# Patient Record
Sex: Male | Born: 1955 | Race: Black or African American | Hispanic: No | Marital: Single | Smoking: Current every day smoker
Health system: Southern US, Community
[De-identification: ages and names within clinical notes are randomized; demographics above are authoritative.]

## PROBLEM LIST (undated history)

## (undated) DIAGNOSIS — K219 Gastro-esophageal reflux disease without esophagitis: Secondary | ICD-10-CM

## (undated) HISTORY — PX: ELBOW SURGERY: SHX618

---

## 2014-05-17 ENCOUNTER — Emergency Department (HOSPITAL_COMMUNITY): Payer: Medicaid Other

## 2014-05-17 ENCOUNTER — Emergency Department (HOSPITAL_COMMUNITY)
Admission: EM | Admit: 2014-05-17 | Discharge: 2014-05-17 | Disposition: A | Discharge status: Home / Self Care | Payer: Medicaid Other | Attending: Family Medicine | Admitting: Family Medicine

## 2014-05-17 ENCOUNTER — Encounter (HOSPITAL_COMMUNITY): Payer: Self-pay | Admitting: Emergency Medicine

## 2014-05-17 DIAGNOSIS — W010XXA Fall on same level from slipping, tripping and stumbling without subsequent striking against object, initial encounter: Secondary | ICD-10-CM | POA: Diagnosis not present

## 2014-05-17 DIAGNOSIS — M19019 Primary osteoarthritis, unspecified shoulder: Secondary | ICD-10-CM | POA: Insufficient documentation

## 2014-05-17 DIAGNOSIS — Z791 Long term (current) use of non-steroidal anti-inflammatories (NSAID): Secondary | ICD-10-CM | POA: Insufficient documentation

## 2014-05-17 DIAGNOSIS — F17200 Nicotine dependence, unspecified, uncomplicated: Secondary | ICD-10-CM | POA: Insufficient documentation

## 2014-05-17 DIAGNOSIS — Y9389 Activity, other specified: Secondary | ICD-10-CM | POA: Insufficient documentation

## 2014-05-17 DIAGNOSIS — S4980XA Other specified injuries of shoulder and upper arm, unspecified arm, initial encounter: Secondary | ICD-10-CM | POA: Insufficient documentation

## 2014-05-17 DIAGNOSIS — Y929 Unspecified place or not applicable: Secondary | ICD-10-CM | POA: Diagnosis not present

## 2014-05-17 DIAGNOSIS — S46909A Unspecified injury of unspecified muscle, fascia and tendon at shoulder and upper arm level, unspecified arm, initial encounter: Secondary | ICD-10-CM | POA: Insufficient documentation

## 2014-05-17 DIAGNOSIS — M25511 Pain in right shoulder: Secondary | ICD-10-CM

## 2014-05-17 MED ORDER — HYDROCODONE-ACETAMINOPHEN 5-325 MG PO TABS: 1.0000 | ORAL_TABLET | Freq: Four times a day (QID) | ORAL | Status: DC | PRN

## 2014-05-17 MED ORDER — OXYCODONE-ACETAMINOPHEN 5-325 MG PO TABS: 1.0000 | ORAL_TABLET | Freq: Once | ORAL | Status: DC

## 2014-05-17 MED ORDER — MORPHINE SULFATE 4 MG/ML IJ SOLN
4.0000 mg | Freq: Once | INTRAMUSCULAR | Status: AC
Start: 2014-05-17 — End: 2014-05-17
  Administered 2014-05-17: 4 mg via INTRAMUSCULAR
  Filled 2014-05-17: qty 1

## 2014-05-17 NOTE — ED Notes (Signed)
Per pt sts right shoulder pain since a slip and fall in the shower this am.

## 2014-05-17 NOTE — Discharge Instructions (Signed)

## 2014-05-17 NOTE — ED Provider Notes (Signed)
CSN: [id number]     Arrival date & time 05/17/14  1327 History   First MD Initiated Contact with Patient 05/17/14 1422     Chief Complaint  Patient presents with  . Shoulder Pain     (Consider location/radiation/quality/duration/timing/severity/associated sxs/prior Treatment) HPI Comments: 58 year old male presents for evaluation of right shoulder pain. He fell in the shower earlier today and his arm got caught behind him. He has a history of arthritis in his shoulder and he thinks he is now broken his shoulder. Pain is severe, 10 out of 10, and throbbing. He describes it as feeling like a toothache. He is having very limited ability to move his arm, and any movement causes increased pain in the shoulder. No numbness distal to the injury, no other injury.  Patient is a 58 y.o. male presenting with shoulder pain.  Shoulder Pain Associated symptoms include arthralgias (see history of present illness).    History reviewed. No pertinent past medical history. History reviewed. No pertinent past surgical history. History reviewed. No pertinent family history. History  Substance Use Topics  . Smoking status: Current Every Day Smoker  . Smokeless tobacco: Not on file  . Alcohol Use: Yes    Review of Systems  Musculoskeletal: Positive for arthralgias (see history of present illness).  All other systems reviewed and are negative.     Allergies  Review of patient's allergies indicates no known allergies.  Home Medications   Prior to Admission medications   Medication Sig Start Date End Date Taking? Authorizing Provider  naproxen sodium (ANAPROX) 220 MG tablet Take 440 mg by mouth 2 (two) times daily as needed (pain).   Yes Historical Provider, MD  HYDROcodone-acetaminophen (NORCO) 5-325 MG per tablet Take 1 tablet by mouth every 6 (six) hours as needed for moderate pain. 05/17/14   Adrian Blackwater Darby Shadwick, PA-C   BP 162/89  Pulse 81  Temp(Src) 98.2 F (36.8 C)  Resp 18  Wt 140 lb (63.504  kg)  SpO2 95% Physical Exam  Nursing note and vitals reviewed. Constitutional: He is oriented to person, place, and time. He appears well-developed and well-nourished. No distress.  HENT:  Head: Normocephalic.  Cardiovascular:  Pulses:      Radial pulses are 2+ on the right side, and 2+ on the left side.  Pulmonary/Chest: Effort normal. No respiratory distress.  Musculoskeletal:       Right shoulder: He exhibits decreased range of motion, tenderness (Diffuse), bony tenderness, deformity (Appears to be deviated posteriorly), pain and decreased strength (Secondary to pain). He exhibits no swelling, no laceration and no spasm.  Neurological: He is alert and oriented to person, place, and time. Coordination normal.  Skin: Skin is warm and dry. No rash noted. He is not diaphoretic.  Psychiatric: He has a normal mood and affect. Judgment normal.    ED Course  Procedures (including critical care time) Labs Review Labs Reviewed - No data to display  Imaging Review Dg Shoulder Right  05/17/2014   CLINICAL DATA:  Status post fall.  Right shoulder pain.  EXAM: RIGHT SHOULDER - 2+ VIEW  COMPARISON:  None.  FINDINGS: No fracture or dislocation is identified. The patient has markedly advanced for age glenohumeral osteoarthritis with extensive subchondral cyst formation and sclerosis about the joint. The humeral head is posteriorly subluxed on the glenoid compatible with joint instability. The acromioclavicular joint is unremarkable. Image right lung and ribs appear normal.  IMPRESSION: No acute abnormality.  Markedly advanced for age glenohumeral osteoarthritis and findings compatible  with glenohumeral joint instability.   Electronically Signed   By: Drusilla Kanner M.D.   On: 05/17/2014 15:28     EKG Interpretation None      MDM   Final diagnoses:  Shoulder arthritis  Right shoulder pain    X-ray reveals only chronic changes, no acute fracture or dislocation. We'll treat with Vicodin when  necessary, swelling, followup with orthopedics.   Meds ordered this encounter  Medications  . DISCONTD: oxyCODONE-acetaminophen (PERCOCET/ROXICET) 5-325 MG per tablet 1 tablet    Sig:   . morphine 4 MG/ML injection 4 mg    Sig:   . naproxen sodium (ANAPROX) 220 MG tablet    Sig: Take 440 mg by mouth 2 (two) times daily as needed (pain).  Marland Kitchen HYDROcodone-acetaminophen (NORCO) 5-325 MG per tablet    Sig: Take 1 tablet by mouth every 6 (six) hours as needed for moderate pain.    Dispense:  20 tablet    Refill:  0    Order Specific Question:  Supervising Provider    Answer:  MERRELL, DAVID J [[id number]]       Graylon Good, PA-C 05/17/14 1620

## 2014-05-17 NOTE — ED Notes (Addendum)
C/o right shoulder pain since falling while getting out of bathtub today.

## 2014-05-18 NOTE — ED Provider Notes (Signed)
Medical screening examination/treatment/procedure(s) were performed by a resident physician or non-physician practitioner and as the supervising physician I was immediately available for consultation/collaboration.  Talyssa Gibas, MD Family Medicine   Latham Kinzler J Shaunn Tackitt, MD 05/18/14 1140 

## 2014-06-01 ENCOUNTER — Ambulatory Visit: Payer: Medicaid Other | Attending: Internal Medicine | Admitting: Internal Medicine

## 2014-06-01 ENCOUNTER — Encounter: Payer: Self-pay | Admitting: Internal Medicine

## 2014-06-01 VITALS — BP 142/80 | HR 77 | Temp 98.6°F | Resp 16 | Ht 69.0 in | Wt 142.0 lb

## 2014-06-01 DIAGNOSIS — M542 Cervicalgia: Secondary | ICD-10-CM | POA: Diagnosis not present

## 2014-06-01 DIAGNOSIS — Z23 Encounter for immunization: Secondary | ICD-10-CM

## 2014-06-01 DIAGNOSIS — F17200 Nicotine dependence, unspecified, uncomplicated: Secondary | ICD-10-CM | POA: Diagnosis not present

## 2014-06-01 DIAGNOSIS — R209 Unspecified disturbances of skin sensation: Secondary | ICD-10-CM | POA: Insufficient documentation

## 2014-06-01 DIAGNOSIS — M25519 Pain in unspecified shoulder: Secondary | ICD-10-CM | POA: Insufficient documentation

## 2014-06-01 DIAGNOSIS — R109 Unspecified abdominal pain: Secondary | ICD-10-CM | POA: Diagnosis present

## 2014-06-01 DIAGNOSIS — I10 Essential (primary) hypertension: Secondary | ICD-10-CM | POA: Diagnosis not present

## 2014-06-01 DIAGNOSIS — M25511 Pain in right shoulder: Secondary | ICD-10-CM

## 2014-06-01 MED ORDER — KETOROLAC TROMETHAMINE 60 MG/2ML IM SOLN
60.0000 mg | Freq: Once | INTRAMUSCULAR | Status: AC
  Administered 2014-06-01: 60 mg via INTRAMUSCULAR

## 2014-06-01 MED ORDER — CLONIDINE HCL 0.1 MG PO TABS
0.1000 mg | ORAL_TABLET | Freq: Once | ORAL | Status: AC
  Administered 2014-06-01: 0.1 mg via ORAL

## 2014-06-01 MED ORDER — LISINOPRIL 10 MG PO TABS: 10.0000 mg | ORAL_TABLET | Freq: Every day | ORAL | Status: AC

## 2014-06-01 MED ORDER — CYCLOBENZAPRINE HCL 10 MG PO TABS: 10.0000 mg | ORAL_TABLET | Freq: Three times a day (TID) | ORAL | Status: DC | PRN

## 2014-06-01 MED ORDER — TRAMADOL HCL 50 MG PO TABS: 50.0000 mg | ORAL_TABLET | Freq: Three times a day (TID) | ORAL | Status: DC | PRN

## 2014-06-01 NOTE — Patient Instructions (Signed)
Please apply for hospital discount with our office so that I may get you further imaging of your shoulder. You will also need to be seen by Sports Medicine or Orthopedics ASAP.   Please begin taking lisinopril once per day for your high blood pressure.  If you begin to experience a dry cough after taking this medication...please call office back. Thanks It was a pleasure to meet you!!

## 2014-06-01 NOTE — Progress Notes (Signed)
Pt is here today to establish care. Pt is C/O severe right shoulder pain. Pt recently re injured his shoulder falling in the bathroom.

## 2014-06-01 NOTE — Progress Notes (Signed)
Patient ID: Caleb Hansen, male   DOB: 31-Aug-1956, 58 y.o.   MRN: [id number]  NGE:[id number]  MWN:[id number]  DOB - 1956-06-04  CC:  Chief Complaint  Patient presents with  . Establish Care       HPI: Caleb Hansen is a 58 y.o. male here today to establish medical care.  Patient presents to clinic today as a follow up from a Urgent care visit from 8/26. He states that he fell in the shower in which his arm got caught behind him.  He had a xray at that time and was found to only have chronic changes on exam.  He was given pain medication and told to follow up with orthopedics.  Today he reports that he injured his right shoulder several years ago during a workout routine and now is having continued pain.  He has had a cortisone injection in May of this year.  He has tried ibuprofen, aleve, and tylenol 3 for pain, which is now not helping the pain and  is now giving him stomach pain.  He states that he just got over H. Pylori a few months ago.  He reports that he has had the pain long term which was only rated at about a 5 out of 10.  He now states that the pain is a 10/10.     No Known Allergies History reviewed. No pertinent past medical history. Current Outpatient Prescriptions on File Prior to Visit  Medication Sig Dispense Refill  . HYDROcodone-acetaminophen (NORCO) 5-325 MG per tablet Take 1 tablet by mouth every 6 (six) hours as needed for moderate pain.  20 tablet  0  . naproxen sodium (ANAPROX) 220 MG tablet Take 440 mg by mouth 2 (two) times daily as needed (pain).       No current facility-administered medications on file prior to visit.   Family History  Problem Relation Age of Onset  . Diabetes Mother   . Hypertension Mother    History   Social History  . Marital Status: Single    Spouse Name: N/A    Number of Children: N/A  . Years of Education: N/A   Occupational History  . Not on file.   Social History Main Topics  . Smoking status: Current Every Day Smoker    . Smokeless tobacco: Not on file  . Alcohol Use: Yes  . Drug Use: Not on file  . Sexual Activity: Not on file   Other Topics Concern  . Not on file   Social History Narrative  . No narrative on file    Review of Systems  Constitutional: Negative for fever and chills.  Eyes: Negative for blurred vision.  Musculoskeletal: Positive for joint pain (shoulder) and neck pain.       Shoulder pain   Neurological: Positive for tingling (in right arm and fingers). Negative for headaches.      Objective:   Filed Vitals:   06/01/14 1520  BP: 180/108  Pulse: 77  Temp: 98.6 F (37 C)  Resp: 16    Physical Exam  Constitutional: He is oriented to person, place, and time.  HENT:  Right Ear: External ear normal.  Left Ear: External ear normal.  Mouth/Throat: Oropharynx is clear and moist.  Eyes: Conjunctivae and EOM are normal. Pupils are equal, round, and reactive to light.  Neck: Normal range of motion. Neck supple.  Cardiovascular: Normal rate, regular rhythm and normal heart sounds.   Pulmonary/Chest: Effort normal and breath sounds normal.  Abdominal: Bowel  sounds are normal. He exhibits no distension. There is tenderness.  Musculoskeletal: Normal range of motion.  Neurological: He is alert and oriented to person, place, and time. He has normal reflexes.  Skin: Skin is warm and dry.  Psychiatric: He has a normal mood and affect. Thought content normal.     No results found for this basename: WBC, HGB, HCT, MCV, PLT   No results found for this basename: CREATININE, BUN, NA, K, CL, CO2    No results found for this basename: HGBA1C   Lipid Panel  No results found for this basename: chol, trig, hdl, cholhdl, vldl, ldlcalc       Assessment and plan:   Caleb Hansen was seen today for establish care.  Diagnoses and associated orders for this visit:  Essential hypertension - Begin lisinopril (PRINIVIL,ZESTRIL) 10 MG tablet; Take 1 tablet (10 mg total) by mouth  daily. - cloNIDine (CATAPRES) tablet 0.1 mg; Take 1 tablet (0.1 mg total) by mouth once.  Pain in joint, shoulder region, right - Ambulatory referral to Sports Medicine - traMADol (ULTRAM) 50 MG tablet; Take 1 tablet (50 mg total) by mouth every 8 (eight) hours as needed. - ketorolac (TORADOL) injection 60 mg; Inject 2 mLs (60 mg total) into the muscle once. - cyclobenzaprine (FLEXERIL) 10 MG tablet; Take 1 tablet (10 mg total) by mouth 3 (three) times daily as needed for muscle spasms.     Return in about 3 months (around 08/31/2014).  The patient was given clear instructions to go to ER or return to medical center if symptoms don't improve, worsen or new problems develop. The patient verbalized understanding.    Holland Commons, NP-C [facility] [phone] 06/16/2014, 8:19 PM

## 2014-08-08 ENCOUNTER — Encounter (HOSPITAL_COMMUNITY): Payer: Self-pay | Admitting: Emergency Medicine

## 2014-08-08 ENCOUNTER — Emergency Department (HOSPITAL_COMMUNITY)
Admission: EM | Admit: 2014-08-08 | Discharge: 2014-08-08 | Disposition: A | Discharge status: Home / Self Care | Payer: No Typology Code available for payment source | Attending: Emergency Medicine | Admitting: Emergency Medicine

## 2014-08-08 DIAGNOSIS — Y92410 Unspecified street and highway as the place of occurrence of the external cause: Secondary | ICD-10-CM | POA: Diagnosis not present

## 2014-08-08 DIAGNOSIS — Y998 Other external cause status: Secondary | ICD-10-CM | POA: Diagnosis not present

## 2014-08-08 DIAGNOSIS — Z79899 Other long term (current) drug therapy: Secondary | ICD-10-CM | POA: Insufficient documentation

## 2014-08-08 DIAGNOSIS — S24109A Unspecified injury at unspecified level of thoracic spinal cord, initial encounter: Secondary | ICD-10-CM | POA: Diagnosis not present

## 2014-08-08 DIAGNOSIS — Y9389 Activity, other specified: Secondary | ICD-10-CM | POA: Insufficient documentation

## 2014-08-08 DIAGNOSIS — Z720 Tobacco use: Secondary | ICD-10-CM | POA: Insufficient documentation

## 2014-08-08 DIAGNOSIS — M25511 Pain in right shoulder: Secondary | ICD-10-CM

## 2014-08-08 DIAGNOSIS — S4991XA Unspecified injury of right shoulder and upper arm, initial encounter: Secondary | ICD-10-CM | POA: Insufficient documentation

## 2014-08-08 DIAGNOSIS — M549 Dorsalgia, unspecified: Secondary | ICD-10-CM
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
## 2014-08-10 ENCOUNTER — Encounter (HOSPITAL_COMMUNITY): Payer: Self-pay | Admitting: *Deleted

## 2014-08-10 ENCOUNTER — Emergency Department (HOSPITAL_COMMUNITY)
Admission: EM | Admit: 2014-08-10 | Discharge: 2014-08-10 | Disposition: A | Discharge status: Home / Self Care | Payer: No Typology Code available for payment source | Attending: Emergency Medicine | Admitting: Emergency Medicine

## 2014-08-10 DIAGNOSIS — Z79899 Other long term (current) drug therapy: Secondary | ICD-10-CM | POA: Insufficient documentation

## 2014-08-10 DIAGNOSIS — Z720 Tobacco use: Secondary | ICD-10-CM | POA: Insufficient documentation

## 2014-08-10 DIAGNOSIS — Y9389 Activity, other specified: Secondary | ICD-10-CM | POA: Insufficient documentation

## 2014-08-10 DIAGNOSIS — M5416 Radiculopathy, lumbar region: Secondary | ICD-10-CM | POA: Insufficient documentation

## 2014-08-10 DIAGNOSIS — S39012A Strain of muscle, fascia and tendon of lower back, initial encounter: Secondary | ICD-10-CM | POA: Insufficient documentation

## 2014-08-10 DIAGNOSIS — Y92410 Unspecified street and highway as the place of occurrence of the external cause: Secondary | ICD-10-CM | POA: Insufficient documentation

## 2014-08-10 DIAGNOSIS — Y998 Other external cause status: Secondary | ICD-10-CM | POA: Insufficient documentation

## 2014-08-10 DIAGNOSIS — S34109A Unspecified injury to unspecified level of lumbar spinal cord, initial encounter: Secondary | ICD-10-CM | POA: Diagnosis present

## 2014-08-10 MED ORDER — CYCLOBENZAPRINE HCL 10 MG PO TABS: 10.0000 mg | ORAL_TABLET | Freq: Two times a day (BID) | ORAL | Status: DC | PRN

## 2014-08-10 MED ORDER — TRAMADOL HCL 50 MG PO TABS
50.0000 mg | ORAL_TABLET | Freq: Once | ORAL | Status: AC
Start: 2014-08-10 — End: 2014-08-10
  Administered 2014-08-10: 50 mg via ORAL
  Filled 2014-08-10: qty 1

## 2014-08-10 MED ORDER — TRAMADOL HCL 50 MG PO TABS
50.0000 mg | ORAL_TABLET | Freq: Four times a day (QID) | ORAL | Status: AC | PRN
Start: 2014-08-10 — End: ?

## 2014-08-10 MED ORDER — CYCLOBENZAPRINE HCL 10 MG PO TABS
10.0000 mg | ORAL_TABLET | Freq: Once | ORAL | Status: AC
  Administered 2014-08-10: 10 mg via ORAL
  Filled 2014-08-10: qty 1

## 2014-08-10 NOTE — Discharge Instructions (Signed)
Please take ibuprofen 400mg  (this is normally 2 over the counter pills) every 6 hours (take with food to minimze stomach irritation).   Take  flexeril and/or tramadol for breakthrough pain, do not drink alcohol, drive, care for children or perfom other critical tasks while taking flexeril and/or flxeril .  Please follow with your primary care doctor in the next 2 days for a check-up. They must obtain records for further management.   Do not hesitate to return to the Emergency Department for any new, worsening or concerning symptoms.    Lumbosacral Radiculopathy Lumbosacral radiculopathy is a pinched nerve or nerves in the low back (lumbosacral area). When this happens you may have weakness in your legs and may not be able to stand on your toes. You may have pain going down into your legs. There may be difficulties with walking normally. There are many causes of this problem. Sometimes this may happen from an injury, or simply from arthritis or boney problems. It may also be caused by other illnesses such as diabetes. If there is no improvement after treatment, further studies may be done to find the exact cause. DIAGNOSIS  X-rays may be needed if the problems become long standing. Electromyograms may be done. This study is one in which the working of nerves and muscles is studied. HOME CARE INSTRUCTIONS   Applications of ice packs may be helpful. Ice can be used in a plastic bag with a towel around it to prevent frostbite to skin. This may be used every 2 hours for 20 to 30 minutes, or as needed, while awake, or as directed by your caregiver.  Only take over-the-counter or prescription medicines for pain, discomfort, or fever as directed by your caregiver.  If physical therapy was prescribed, follow your caregiver's directions. SEEK IMMEDIATE MEDICAL CARE IF:   You have pain not controlled with medications.  You seem to be getting worse rather than better.  You develop increasing weakness  in your legs.  You develop loss of bowel or bladder control.  You have difficulty with walking or balance, or develop clumsiness in the use of your legs.  You have a fever. MAKE SURE YOU:   Understand these instructions.  Will watch your condition.  Will get help right away if you are not doing well or get worse. Document Released: 09/08/2005 Document Revised: 12/01/2011 Document Reviewed: 04/28/2008 [facility]ExitCare Patient Information 2015 [place]ExitCare, [place]LLC. This information is not intended to replace advice given to you by your health care provider. Make sure you discuss any questions you have with your health care provider.
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
## 2014-10-02 ENCOUNTER — Telehealth: Payer: Self-pay | Admitting: Internal Medicine

## 2014-10-02 NOTE — Telephone Encounter (Signed)
[facility] called to NPI, patient is currently changing primary care.

## 2016-01-17 LAB — GLUCOSE, POCT (MANUAL RESULT ENTRY): POC Glucose: 108 mg/dl — AB (ref 70–99)

## 2016-03-03 ENCOUNTER — Ambulatory Visit: Payer: Medicaid Other | Admitting: Physical Therapy

## 2016-03-04 ENCOUNTER — Ambulatory Visit: Payer: Medicaid Other | Attending: Internal Medicine | Admitting: Physical Therapy

## 2016-03-04 ENCOUNTER — Encounter: Payer: Self-pay | Admitting: Physical Therapy

## 2016-03-04 DIAGNOSIS — M6281 Muscle weakness (generalized): Secondary | ICD-10-CM | POA: Diagnosis present

## 2016-03-04 DIAGNOSIS — M79641 Pain in right hand: Secondary | ICD-10-CM | POA: Diagnosis present

## 2016-03-04 DIAGNOSIS — M25521 Pain in right elbow: Secondary | ICD-10-CM | POA: Insufficient documentation

## 2016-03-04 DIAGNOSIS — M25511 Pain in right shoulder: Secondary | ICD-10-CM | POA: Diagnosis not present

## 2016-03-04 NOTE — Therapy (Signed)
[facility] [address] [place], [place], [address] Phone: [phone]   Fax:  [phone]  Physical Therapy Evaluation  Patient Details  Name: Caleb Hansen MRN: [id number] Date of Birth: Feb 07, 1956 Referring Provider: Kerrie Pleasure, MD  Encounter Date: 03/04/2016      PT End of Session - 03/04/16 1530    Visit Number 1   Authorization Type medicaid   PT Start Time 1330   PT Stop Time 1408   PT Time Calculation (min) 38 min   Activity Tolerance Patient tolerated treatment well   Behavior During Therapy Caleb Hansen for tasks assessed/performed      History reviewed. No pertinent past medical history.  Past Surgical History  Procedure Laterality Date  . Elbow surgery Left     There were no vitals filed for this visit.       Subjective Assessment - 03/04/16 1336    Subjective Arthritis and rotator cuff pain. Pain has recently begun travelling farther down arm. Feels like elbow is on fire and now feels N/T in hand. Reports doing yard work a couple of times a week. Fell on the head of a nail in a plank of wood (caught fall with hand) and that was when he began feeling pain in shoulder and arm (approx 1 month ago).    Currently in Pain? Yes   Pain Score 7    Pain Descriptors / Indicators Burning;Throbbing   Pain Radiating Towards RUE to hand (radial nerve distribution)   Pain Onset 1 to 4 weeks ago   Pain Frequency Constant   Aggravating Factors  reaching OH and behind back, laying on R shoulder, letting arm hand (feels throbbing in elbow)   Pain Relieving Factors rest            [facility] PT Assessment - 03/04/16 0001    Assessment   Medical Diagnosis R elbow pain   Referring Provider Kerrie Pleasure, MD   Onset Date/Surgical Date 02/13/16  approximately   Hand Dominance Right   Next MD Visit will call to schedule   Prior Therapy no   Precautions   Precautions None   Restrictions   Weight Bearing Restrictions No   Balance Screen   Has the patient fallen in the past 6 months Yes   How many times? 1  caught fall using RUE   Home Environment   Living Environment Private residence   Living Arrangements Other relatives   Type of Home House   Prior Function   Level of Independence Independent   Cognition   Overall Cognitive Status Within Functional Limits for tasks assessed   Sensation   Light Touch Impaired by gross assessment   Posture/Postural Control   Posture Comments winging scapula bilat   ROM / Strength   AROM / PROM / Strength AROM;PROM;Strength   AROM   AROM Assessment Site Shoulder   Right/Left Shoulder Right   Right Shoulder Flexion 74 Degrees   Right Shoulder ABduction 40 Degrees   Right Shoulder Internal Rotation --  HBB S1   Right Shoulder External Rotation --  HBH C8   PROM   PROM Assessment Site Shoulder   Right/Left Shoulder Right   Right Shoulder Flexion 120 Degrees  pain   Strength   Overall Strength Comments grip significantly less in R.    Strength Assessment Site Shoulder   Right/Left Shoulder Right   Right Shoulder Flexion 3-/5  unable to move through ROM   Right Shoulder Extension 3-/5  Right Shoulder ABduction 3-/5   Right Shoulder Internal Rotation 3+/5   Right Shoulder External Rotation 3+/5   Palpation   Palpation comment no TTP in elbow                   [facility] Adult PT Treatment/Exercise - 03/04/16 0001    Exercises   Exercises Shoulder   Shoulder Exercises: Seated   Other Seated Exercises cervical retraction x10   Shoulder Exercises: Standing   Retraction 10 reps   Shoulder Exercises: Isometric Strengthening   Extension 5X10"   ABduction 5X10"   Shoulder Exercises: Stretch   Table Stretch - Flexion 5 reps;10 seconds                PT Education - 03/04/16 1529    Education provided Yes   Education Details anatomy of condition, POC, HEP, radicular pain   Person(s) Educated Patient   Methods  Explanation;Demonstration;Tactile cues;Verbal cues;Handout   Comprehension Verbalized understanding;Returned demonstration;Verbal cues required;Tactile cues required                    Plan - 03/04/16 1530    Clinical Impression Statement Pt presents with complaints of periscapular pain that travels into shoulder, burning in elbow and N/T in 4th and 5th digits. Denied tenderness to palpation in elbow and no notable weakness surrounding elbow complex. Significant weakness and pain noted in shoulder girdle and neural tension of radial nerve. Pt was provided with HEP to improve stability surrounding joint. Pt reprots he is considering surgical intervention but had to come to PT first.    PT Treatment/Interventions Therapeutic activities;Therapeutic exercise;Patient/family education   PT Next Visit Plan one-time visit   Consulted and Agree with Plan of Care Patient      Patient will benefit from skilled therapeutic intervention in order to improve the following deficits and impairments:  Pain, Impaired sensation, Decreased strength, Decreased range of motion  Visit Diagnosis: Pain in right shoulder - Plan: PT plan of care cert/re-cert  Pain in right elbow - Plan: PT plan of care cert/re-cert  Pain in right hand - Plan: PT plan of care cert/re-cert  Muscle weakness (generalized) - Plan: PT plan of care cert/re-cert     Problem List There are no active problems to display for this patient.  Willodene Stallings C. Laelani Vasko PT, DPT 03/04/2016 3:37 PM   [facility] [address] [place], [place], [address] Phone: [phone]   Fax:  [phone]  Name: Caleb ClosRonaldo Hansen MRN: [id number] Date of Birth: 07-Aug-1956

## 2016-06-25 ENCOUNTER — Other Ambulatory Visit: Payer: Self-pay | Admitting: Orthopedic Surgery

## 2016-06-25 DIAGNOSIS — M19011 Primary osteoarthritis, right shoulder: Secondary | ICD-10-CM

## 2016-07-11 ENCOUNTER — Other Ambulatory Visit: Payer: Medicaid Other

## 2020-11-06 ENCOUNTER — Other Ambulatory Visit: Payer: Self-pay

## 2020-11-06 ENCOUNTER — Emergency Department (HOSPITAL_COMMUNITY): Payer: Medicaid Other

## 2020-11-06 ENCOUNTER — Encounter (HOSPITAL_COMMUNITY): Payer: Self-pay | Admitting: Emergency Medicine

## 2020-11-06 ENCOUNTER — Emergency Department (HOSPITAL_COMMUNITY)
Admission: EM | Admit: 2020-11-06 | Discharge: 2020-11-06 | Disposition: A | Discharge status: Home / Self Care | Payer: Medicaid Other | Attending: Emergency Medicine | Admitting: Emergency Medicine

## 2020-11-06 DIAGNOSIS — Z79899 Other long term (current) drug therapy: Secondary | ICD-10-CM | POA: Insufficient documentation

## 2020-11-06 DIAGNOSIS — I160 Hypertensive urgency: Secondary | ICD-10-CM | POA: Diagnosis not present

## 2020-11-06 DIAGNOSIS — R519 Headache, unspecified: Secondary | ICD-10-CM | POA: Diagnosis present

## 2020-11-06 DIAGNOSIS — F17210 Nicotine dependence, cigarettes, uncomplicated: Secondary | ICD-10-CM | POA: Diagnosis not present

## 2020-11-06 DIAGNOSIS — I10 Essential (primary) hypertension: Secondary | ICD-10-CM | POA: Insufficient documentation

## 2020-11-06 LAB — CBC
HCT: 43.3 % (ref 39.0–52.0)
Hemoglobin: 15 g/dL (ref 13.0–17.0)
MCH: 33.9 pg (ref 26.0–34.0)
MCHC: 34.6 g/dL (ref 30.0–36.0)
MCV: 98 fL (ref 80.0–100.0)
Platelets: 247 10*3/uL (ref 150–400)
RBC: 4.42 MIL/uL (ref 4.22–5.81)
RDW: 12.4 % (ref 11.5–15.5)
WBC: 6.7 10*3/uL (ref 4.0–10.5)
nRBC: 0 % (ref 0.0–0.2)

## 2020-11-06 LAB — BASIC METABOLIC PANEL
Anion gap: 12 (ref 5–15)
BUN: 10 mg/dL (ref 8–23)
CO2: 25 mmol/L (ref 22–32)
Calcium: 9.1 mg/dL (ref 8.9–10.3)
Chloride: 98 mmol/L (ref 98–111)
Creatinine, Ser: 0.66 mg/dL (ref 0.61–1.24)
GFR, Estimated: >60 mL/min (ref >60)
Glucose, Bld: 96 mg/dL (ref 70–99)
Potassium: 4 mmol/L (ref 3.5–5.1)
Sodium: 135 mmol/L (ref 135–145)

## 2020-11-06 LAB — TROPONIN I (HIGH SENSITIVITY): Troponin I (High Sensitivity): 5 ng/L (ref <18)

## 2020-11-06 MED ORDER — LISINOPRIL 10 MG PO TABS
10.0000 mg | ORAL_TABLET | Freq: Once | ORAL | Status: AC
  Administered 2020-11-06: 10 mg via ORAL
  Filled 2020-11-06: qty 1

## 2020-11-06 MED ORDER — METHADONE HCL 10 MG/ML PO CONC
80.0000 mg | Freq: Once | ORAL | Status: AC
  Administered 2020-11-06: 80 mg via ORAL
  Filled 2020-11-06: qty 8
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
## 2020-11-06 NOTE — ED Notes (Signed)
Called x1, no response.

## 2020-11-06 NOTE — ED Provider Notes (Signed)
[facility]-EMERGENCY DEPT Provider Note   CSN: [id number] Arrival date & time: 11/06/20  1131     History Chief Complaint  Patient presents with  . Chest Pain  . Hypertension  . Emesis  . Headache    Caleb Hansen is a 65 y.o. male.  HPI Patient presents with concern of hypertensive urgency, headache.  Patient states" I feel good" but notes that he does have mild diffuse headache. No new weakness, or other complaints. He has history of methadone clinic visits, going back 2 years, but he missed today's due to oversleeping. Seems as though the headache began at some point earlier in the day, without specificity. He denies chest pain, dyspnea or other focal complaints.  Today, he saw his sister, complained about headache, and while checking his blood pressure found to be elevated.  With hypertensive urgency, headache, she suggested he come here for evaluation.  In the interim, he called his physician's office, and obtained a prescription which will be available tomorrow for antihypertensives.     History reviewed. No pertinent past medical history.  There are no problems to display for this patient.   Past Surgical History:  Procedure Laterality Date  . ELBOW SURGERY Left        Family History  Problem Relation Age of Onset  . Diabetes Mother   . Hypertension Mother     Social History   Tobacco Use  . Smoking status: Current Every Day Smoker    Packs/day: 0.50    Types: Cigarettes  Substance Use Topics  . Alcohol use: Yes    Home Medications Prior to Admission medications   Medication Sig Start Date End Date Taking? Authorizing Provider  cyclobenzaprine (FLEXERIL) 10 MG tablet Take 1 tablet (10 mg total) by mouth 2 (two) times daily as needed for muscle spasms. 08/10/14   Pisciotta, Joni Reining, PA-C  lisinopril (PRINIVIL,ZESTRIL) 10 MG tablet Take 1 tablet (10 mg total) by mouth daily. 06/01/14   Ambrose Finland, NP  naproxen sodium  (ANAPROX) 220 MG tablet Take 660 mg by mouth 2 (two) times daily as needed (pain).     [provider]  traMADol (ULTRAM) 50 MG tablet Take 1 tablet (50 mg total) by mouth every 6 (six) hours as needed. 08/10/14   Pisciotta, Joni Reining, PA-C    Allergies    Patient has no known allergies.  Review of Systems   Review of Systems  Constitutional:       Per HPI, otherwise negative  HENT:       Per HPI, otherwise negative  Respiratory:       Per HPI, otherwise negative  Cardiovascular:       Per HPI, otherwise negative  Gastrointestinal: Negative for vomiting.  Endocrine:       Negative aside from HPI  Genitourinary:       Neg aside from HPI   Musculoskeletal:       Per HPI, otherwise negative  Skin: Negative.   Neurological: Positive for headaches. Negative for syncope and weakness.    Physical Exam Updated Vital Signs BP (!) 179/117 (BP Location: Right Arm)   Pulse 78   Temp 98.5 F (36.9 C) (Oral)   Resp 18   Ht 5\' 9"  (1.753 m)   Wt 65 kg   SpO2 93%   BMI 21.16 kg/m   Physical Exam Vitals and nursing note reviewed.  Constitutional:      General: He is not in acute distress.    Appearance:  He is well-developed.  HENT:     Head: Normocephalic and atraumatic.  Eyes:     Extraocular Movements: EOM normal.     Conjunctiva/sclera: Conjunctivae normal.  Cardiovascular:     Rate and Rhythm: Normal rate and regular rhythm.  Pulmonary:     Effort: Pulmonary effort is normal. No respiratory distress.     Breath sounds: No stridor.  Abdominal:     General: There is no distension.  Musculoskeletal:        General: No edema.     Comments: No deformities, no complaints about pain  Skin:    General: Skin is warm and dry.  Neurological:     Mental Status: He is alert and oriented to person, place, and time.     Cranial Nerves: Cranial nerves are intact.     Motor: Motor function is intact. No tremor.     Coordination: Coordination normal.  Psychiatric:         Mood and Affect: Mood and affect normal.     ED Results / Procedures / Treatments   Labs (all labs ordered are listed, but only abnormal results are displayed) Labs Reviewed  BASIC METABOLIC PANEL  CBC  TROPONIN I (HIGH SENSITIVITY)    EKG EKG Interpretation  Date/Time:  Tuesday November 06 2020 11:50:50 EST Ventricular Rate:  80 PR Interval:    QRS Duration: 90 QT Interval:  380 QTC Calculation: 439 R Axis:   89 Text Interpretation: Sinus rhythm Probable left atrial enlargement Borderline right axis deviation Left ventricular hypertrophy ST-t wave abnormality Artifact Abnormal ECG Confirmed by Gerhard Munch [phone]) on 11/06/2020 6:11:11 PM   Radiology DG Chest 2 View  Result Date: 11/06/2020 CLINICAL DATA:  Chest pain EXAM: CHEST - 2 VIEW COMPARISON:  None. FINDINGS: The heart size and mediastinal contours are within normal limits. Both lungs are clear. The visualized skeletal structures are unremarkable. IMPRESSION: Negative. Electronically Signed   By: Charlett Nose M.D.   On: 11/06/2020 13:17    Procedures Procedures   Medications Ordered in ED Medications  methadone (DOLOPHINE) 10 MG/ML solution 80 mg (has no administration in time range)  lisinopril (ZESTRIL) tablet 10 mg (has no administration in time range)    ED Course  I have reviewed the triage vital signs and the nursing notes.  Pertinent labs & imaging results that were available during my care of the patient were reviewed by me and considered in my medical decision making (see chart for details).  Adult male with history of hypertension, methadone dependency presents with concern of headache, hypertensive urgency. No evidence for endorgan damage, patient's endorsement of feeling generally good is reassuring, there are some suspicion for his medication noncompliance and missing methadone clinic visit today are contributing to his physical complaints.  With otherwise reassuring findings, little suspicion for  atypical ACS, no weakness or numbness suggesting dissection or other vascular emergency, or CNS pathology, the patient received antihypertensive, methadone, was discharged with appropriate outpatient follow-up. Final Clinical Impression(s) / ED Diagnoses Final diagnoses:  Hypertensive urgency  Bad headache     Gerhard Munch, MD 11/06/20 1843

## 2020-11-06 NOTE — Discharge Instructions (Addendum)
As discussed, your evaluation today has been largely reassuring.  But, it is important that you monitor your condition carefully, and do not hesitate to return to the ED if you develop new, or concerning changes in your condition.  Otherwise, please follow-up with your physician for appropriate ongoing care.  If you are unable to follow-up with your physician, or require additional resources such as medication assistance, please use the provided information for our community health center.

## 2021-07-18 ENCOUNTER — Encounter (HOSPITAL_COMMUNITY): Payer: Self-pay | Admitting: Emergency Medicine

## 2021-07-18 ENCOUNTER — Emergency Department (HOSPITAL_COMMUNITY): Payer: Medicaid Other

## 2021-07-18 ENCOUNTER — Emergency Department (HOSPITAL_COMMUNITY)
Admission: EM | Admit: 2021-07-18 | Discharge: 2021-07-18 | Disposition: A | Discharge status: Home / Self Care | Payer: Medicaid Other | Attending: Emergency Medicine | Admitting: Emergency Medicine

## 2021-07-18 DIAGNOSIS — S0990XA Unspecified injury of head, initial encounter: Secondary | ICD-10-CM | POA: Insufficient documentation

## 2021-07-18 DIAGNOSIS — Y92480 Sidewalk as the place of occurrence of the external cause: Secondary | ICD-10-CM | POA: Diagnosis not present

## 2021-07-18 DIAGNOSIS — W010XXA Fall on same level from slipping, tripping and stumbling without subsequent striking against object, initial encounter: Secondary | ICD-10-CM | POA: Insufficient documentation

## 2021-07-18 DIAGNOSIS — Z5321 Procedure and treatment not carried out due to patient leaving prior to being seen by health care provider: Secondary | ICD-10-CM | POA: Insufficient documentation

## 2021-07-18 NOTE — ED Triage Notes (Signed)
Patient here for evaluation of facial pain and broken teeth after he tripped and fell on the sidewalk Tuesday night 07/16/2021. Patient denies LOC, is alert, oriented, and in no apparent distress at this time.

## 2021-07-18 NOTE — ED Notes (Signed)
Pt left AMA due to wait 

## 2021-07-18 NOTE — ED Provider Notes (Signed)
Emergency Medicine Provider Triage Evaluation Note  Caleb Hansen , a 65 y.o. male  was evaluated in triage.  Pt complains of fall with face and head injury 2 days ago.  Review of Systems  Positive: Etoh, mouth pain, broken teeth. No loc Negative: Blood thinner  Physical Exam  BP (!) 159/86 (BP Location: Left Arm)   Pulse 63   Temp 97.7 F (36.5 C) (Oral)   Resp 16   SpO2 94%  Gen:   Awake, no distress   Resp:  Normal effort  MSK:   Moves extremities without difficulty  Other:  Broken bilateral upper incisors, abrasion lip no laceration  Medical Decision Making  Medically screening exam initiated at 1:54 PM.  Appropriate orders placed.  Jye Mattera was informed that the remainder of the evaluation will be completed by another provider, this initial triage assessment does not replace that evaluation, and the importance of remaining in the ED until their evaluation is complete.  Ct head and maxillofacial ordered   Margarita Grizzle, MD 07/18/21 1356

## 2021-09-22 DIAGNOSIS — I10 Essential (primary) hypertension: Secondary | ICD-10-CM

## 2021-09-22 HISTORY — DX: Essential (primary) hypertension: I10

## 2022-09-22 DIAGNOSIS — F1120 Opioid dependence, uncomplicated: Secondary | ICD-10-CM

## 2022-09-22 HISTORY — DX: Opioid dependence, uncomplicated: F11.20

## 2022-10-06 ENCOUNTER — Encounter (HOSPITAL_COMMUNITY): Payer: Self-pay

## 2022-10-06 ENCOUNTER — Other Ambulatory Visit: Payer: Self-pay

## 2022-10-06 ENCOUNTER — Inpatient Hospital Stay (HOSPITAL_COMMUNITY)
Admission: EM | Admit: 2022-10-06 | Discharge: 2022-10-07 | DRG: 369 | Disposition: A | Discharge status: Home / Self Care | Payer: Medicaid Other | Attending: Internal Medicine | Admitting: Internal Medicine

## 2022-10-06 ENCOUNTER — Emergency Department (HOSPITAL_COMMUNITY): Payer: Medicaid Other

## 2022-10-06 DIAGNOSIS — K920 Hematemesis: Secondary | ICD-10-CM | POA: Diagnosis present

## 2022-10-06 DIAGNOSIS — Z634 Disappearance and death of family member: Secondary | ICD-10-CM

## 2022-10-06 DIAGNOSIS — K2101 Gastro-esophageal reflux disease with esophagitis, with bleeding: Secondary | ICD-10-CM | POA: Diagnosis present

## 2022-10-06 DIAGNOSIS — K922 Gastrointestinal hemorrhage, unspecified: Secondary | ICD-10-CM | POA: Diagnosis not present

## 2022-10-06 DIAGNOSIS — Z8249 Family history of ischemic heart disease and other diseases of the circulatory system: Secondary | ICD-10-CM | POA: Diagnosis not present

## 2022-10-06 DIAGNOSIS — K219 Gastro-esophageal reflux disease without esophagitis: Secondary | ICD-10-CM | POA: Diagnosis not present

## 2022-10-06 DIAGNOSIS — R1310 Dysphagia, unspecified: Secondary | ICD-10-CM | POA: Diagnosis present

## 2022-10-06 DIAGNOSIS — Z560 Unemployment, unspecified: Secondary | ICD-10-CM | POA: Diagnosis not present

## 2022-10-06 DIAGNOSIS — F1120 Opioid dependence, uncomplicated: Secondary | ICD-10-CM | POA: Diagnosis present

## 2022-10-06 DIAGNOSIS — Z79899 Other long term (current) drug therapy: Secondary | ICD-10-CM

## 2022-10-06 DIAGNOSIS — I10 Essential (primary) hypertension: Secondary | ICD-10-CM | POA: Diagnosis present

## 2022-10-06 DIAGNOSIS — E785 Hyperlipidemia, unspecified: Secondary | ICD-10-CM | POA: Diagnosis present

## 2022-10-06 DIAGNOSIS — K31819 Angiodysplasia of stomach and duodenum without bleeding: Secondary | ICD-10-CM | POA: Diagnosis not present

## 2022-10-06 DIAGNOSIS — K5520 Angiodysplasia of colon without hemorrhage: Secondary | ICD-10-CM

## 2022-10-06 DIAGNOSIS — F1010 Alcohol abuse, uncomplicated: Secondary | ICD-10-CM | POA: Diagnosis present

## 2022-10-06 DIAGNOSIS — M47817 Spondylosis without myelopathy or radiculopathy, lumbosacral region: Secondary | ICD-10-CM | POA: Diagnosis present

## 2022-10-06 DIAGNOSIS — F17210 Nicotine dependence, cigarettes, uncomplicated: Secondary | ICD-10-CM | POA: Diagnosis present

## 2022-10-06 DIAGNOSIS — E871 Hypo-osmolality and hyponatremia: Secondary | ICD-10-CM | POA: Diagnosis present

## 2022-10-06 DIAGNOSIS — R7401 Elevation of levels of liver transaminase levels: Secondary | ICD-10-CM | POA: Diagnosis present

## 2022-10-06 DIAGNOSIS — I700 Atherosclerosis of aorta: Secondary | ICD-10-CM | POA: Diagnosis present

## 2022-10-06 DIAGNOSIS — K2210 Ulcer of esophagus without bleeding: Secondary | ICD-10-CM | POA: Diagnosis not present

## 2022-10-06 HISTORY — DX: Gastro-esophageal reflux disease without esophagitis: K21.9

## 2022-10-06 LAB — URINALYSIS, ROUTINE W REFLEX MICROSCOPIC
Bilirubin Urine: NEGATIVE
Glucose, UA: NEGATIVE mg/dL
Hgb urine dipstick: NEGATIVE
Ketones, ur: NEGATIVE mg/dL
Nitrite: NEGATIVE
Protein, ur: NEGATIVE mg/dL
Specific Gravity, Urine: 1.026 (ref 1.005–1.030)
pH: 6 (ref 5.0–8.0)

## 2022-10-06 LAB — CBC WITH DIFFERENTIAL/PLATELET
Abs Immature Granulocytes: 0.04 10*3/uL (ref 0.00–0.07)
Basophils Absolute: 0 10*3/uL (ref 0.0–0.1)
Basophils Relative: 0 %
Eosinophils Absolute: 0 10*3/uL (ref 0.0–0.5)
Eosinophils Relative: 0 %
HCT: 44.5 % (ref 39.0–52.0)
Hemoglobin: 15.8 g/dL (ref 13.0–17.0)
Immature Granulocytes: 1 %
Lymphocytes Relative: 42 %
Lymphs Abs: 2.9 10*3/uL (ref 0.7–4.0)
MCH: 34.7 pg — ABNORMAL HIGH (ref 26.0–34.0)
MCHC: 35.5 g/dL (ref 30.0–36.0)
MCV: 97.8 fL (ref 80.0–100.0)
Monocytes Absolute: 0.8 10*3/uL (ref 0.1–1.0)
Monocytes Relative: 12 %
Neutro Abs: 3.2 10*3/uL (ref 1.7–7.7)
Neutrophils Relative %: 45 %
Platelets: 238 10*3/uL (ref 150–400)
RBC: 4.55 MIL/uL (ref 4.22–5.81)
RDW: 12.1 % (ref 11.5–15.5)
WBC: 7 10*3/uL (ref 4.0–10.5)
nRBC: 0 % (ref 0.0–0.2)

## 2022-10-06 LAB — COMPREHENSIVE METABOLIC PANEL
ALT: 66 U/L — ABNORMAL HIGH (ref 0–44)
AST: 67 U/L — ABNORMAL HIGH (ref 15–41)
Albumin: 3.5 g/dL (ref 3.5–5.0)
Alkaline Phosphatase: 52 U/L (ref 38–126)
Anion gap: 12 (ref 5–15)
BUN: 7 mg/dL — ABNORMAL LOW (ref 8–23)
CO2: 23 mmol/L (ref 22–32)
Calcium: 8.9 mg/dL (ref 8.9–10.3)
Chloride: 96 mmol/L — ABNORMAL LOW (ref 98–111)
Creatinine, Ser: 1.02 mg/dL (ref 0.61–1.24)
GFR, Estimated: >60 mL/min (ref >60)
Glucose, Bld: 128 mg/dL — ABNORMAL HIGH (ref 70–99)
Potassium: 3.5 mmol/L (ref 3.5–5.1)
Sodium: 131 mmol/L — ABNORMAL LOW (ref 135–145)
Total Bilirubin: 0.6 mg/dL (ref 0.3–1.2)
Total Protein: 8.3 g/dL — ABNORMAL HIGH (ref 6.5–8.1)

## 2022-10-06 LAB — TYPE AND SCREEN
ABO/RH(D): AB POS
Antibody Screen: NEGATIVE

## 2022-10-06 LAB — HIV ANTIBODY (ROUTINE TESTING W REFLEX): HIV Screen 4th Generation wRfx: NONREACTIVE

## 2022-10-06 LAB — PROTIME-INR
INR: 1.2 (ref 0.8–1.2)
Prothrombin Time: 14.9 seconds (ref 11.4–15.2)

## 2022-10-06 LAB — LIPASE, BLOOD: Lipase: 25 U/L (ref 11–51)

## 2022-10-06 LAB — ABO/RH: ABO/RH(D): AB POS

## 2022-10-06 LAB — ETHANOL: Alcohol, Ethyl (B): 44 mg/dL — ABNORMAL HIGH (ref <10)

## 2022-10-06 MED ORDER — LORAZEPAM 1 MG PO TABS: 1.0000 mg | ORAL_TABLET | ORAL | Status: DC | PRN

## 2022-10-06 MED ORDER — HYDRALAZINE HCL 20 MG/ML IJ SOLN
5.0000 mg | Freq: Four times a day (QID) | INTRAMUSCULAR | Status: DC | PRN
  Administered 2022-10-06 – 2022-10-07 (×2): 5 mg via INTRAVENOUS
  Filled 2022-10-06: qty 1

## 2022-10-06 MED ORDER — PANTOPRAZOLE SODIUM 40 MG IV SOLR
40.0000 mg | Freq: Two times a day (BID) | INTRAVENOUS | Status: DC
  Administered 2022-10-06 – 2022-10-07 (×2): 40 mg via INTRAVENOUS
  Filled 2022-10-06: qty 10

## 2022-10-06 MED ORDER — FOLIC ACID 1 MG PO TABS
1.0000 mg | ORAL_TABLET | Freq: Every day | ORAL | Status: DC
  Administered 2022-10-06: 1 mg via ORAL
  Filled 2022-10-06: qty 1

## 2022-10-06 MED ORDER — ADULT MULTIVITAMIN W/MINERALS CH
1.0000 | ORAL_TABLET | Freq: Every day | ORAL | Status: DC
  Administered 2022-10-06: 1 via ORAL
  Filled 2022-10-06: qty 1

## 2022-10-06 MED ORDER — LORAZEPAM 1 MG PO TABS
1.0000 mg | ORAL_TABLET | ORAL | Status: DC | PRN
  Administered 2022-10-06 – 2022-10-07 (×2): 1 mg via ORAL
  Filled 2022-10-06 (×2): qty 1

## 2022-10-06 MED ORDER — HYDRALAZINE HCL 25 MG PO TABS
25.0000 mg | ORAL_TABLET | Freq: Four times a day (QID) | ORAL | Status: DC | PRN
  Administered 2022-10-06: 25 mg via ORAL
  Filled 2022-10-06: qty 1

## 2022-10-06 MED ORDER — ONDANSETRON HCL 4 MG/2ML IJ SOLN
4.0000 mg | Freq: Four times a day (QID) | INTRAMUSCULAR | Status: DC | PRN
  Administered 2022-10-06: 4 mg via INTRAVENOUS
  Filled 2022-10-06: qty 2

## 2022-10-06 MED ORDER — METHADONE HCL 40 MG PO TBSO: 40.0000 mg | ORAL_TABLET | Freq: Every day | ORAL | Status: DC

## 2022-10-06 MED ORDER — IOHEXOL 350 MG/ML SOLN
75.0000 mL | Freq: Once | INTRAVENOUS | Status: AC | PRN
  Administered 2022-10-06: 75 mL via INTRAVENOUS

## 2022-10-06 MED ORDER — THIAMINE MONONITRATE 100 MG PO TABS
100.0000 mg | ORAL_TABLET | Freq: Every day | ORAL | Status: DC
  Administered 2022-10-06: 100 mg via ORAL
  Filled 2022-10-06: qty 1

## 2022-10-06 MED ORDER — THIAMINE HCL 100 MG/ML IJ SOLN: 100.0000 mg | Freq: Every day | INTRAMUSCULAR | Status: DC

## 2022-10-06 MED ORDER — METHADONE HCL 10 MG PO TABS: 40.0000 mg | ORAL_TABLET | Freq: Every day | ORAL | Status: DC

## 2022-10-06 NOTE — ED Notes (Addendum)
This RN assumed care of patient, transfer of care report recieved. Pt presents to ED with hematemesis, elevated BP and tachycardia. Pt presents with spontaneous breathing, equal bilaterally, and speaks full sentences without difficulty. Pt skin tone is appropriate for ethnicity, dry and warm. Pt connected to CCM, pulse ox and BP. Support person at bedside.

## 2022-10-06 NOTE — ED Provider Notes (Signed)
[facility] EMERGENCY DEPARTMENT Provider Note   CSN: [id number] Arrival date & time: 10/06/22  1130     History  Chief Complaint  Patient presents with   Hematemesis    Dayshaun Whobrey is a 67 y.o. male with a past medical history of GERD, hypertension presenting today for evaluation of nausea, vomiting, abdominal pain. Pt reports he has had intermittent epigastric abdominal pain with acid reflux since November.  He reports that in a the last couple days the pain has gotten worse.  This morning he had 1 episode of vomiting with dark bloody emesis.  No fever, constipation or diarrhea, blood in stool or urine. He admitted to drinking about 3-4 bottles of beers a day. He reports that he has not taken his blood pressure medication today.  HPI  Past Medical History:  Diagnosis Date   GERD (gastroesophageal reflux disease)    Hypertension 2023   Opiate dependence (HCC) 09/2022   Previous snorting of heroin.  Maintained on methadone for years as of 09/2022   Past Surgical History:  Procedure Laterality Date   ELBOW SURGERY Left      Home Medications Prior to Admission medications   Medication Sig Start Date End Date Taking? Authorizing Provider  cyclobenzaprine (FLEXERIL) 10 MG tablet Take 1 tablet (10 mg total) by mouth 2 (two) times daily as needed for muscle spasms. 08/10/14   Pisciotta, Joni Reining, PA-C  lisinopril (PRINIVIL,ZESTRIL) 10 MG tablet Take 1 tablet (10 mg total) by mouth daily. 06/01/14   Ambrose Finland, NP  naproxen sodium (ANAPROX) 220 MG tablet Take 660 mg by mouth 2 (two) times daily as needed (pain).     [provider]  traMADol (ULTRAM) 50 MG tablet Take 1 tablet (50 mg total) by mouth every 6 (six) hours as needed. 08/10/14   Pisciotta, Joni Reining, PA-C      Allergies    Patient has no known allergies.    Review of Systems   Review of Systems  Gastrointestinal:  Positive for nausea and vomiting.    Physical Exam Updated Vital  Signs BP (!) 151/98 (BP Location: Right Arm)   Pulse 77   Temp 97.7 F (36.5 C) (Oral)   Resp 17   Ht 5\' 9"  (1.753 m)   Wt 59 kg   SpO2 95%   BMI 19.20 kg/m  Physical Exam Vitals and nursing note reviewed.  Constitutional:      Appearance: Normal appearance.  HENT:     Head: Normocephalic and atraumatic.     Mouth/Throat:     Mouth: Mucous membranes are moist.  Eyes:     General: No scleral icterus. Cardiovascular:     Rate and Rhythm: Normal rate and regular rhythm.     Pulses: Normal pulses.     Heart sounds: Normal heart sounds.  Pulmonary:     Effort: Pulmonary effort is normal.     Breath sounds: Normal breath sounds.  Abdominal:     General: Abdomen is flat.     Palpations: Abdomen is soft.     Tenderness: There is no abdominal tenderness.  Musculoskeletal:        General: No deformity.  Skin:    General: Skin is warm.     Findings: No rash.  Neurological:     General: No focal deficit present.     Mental Status: He is alert.  Psychiatric:        Mood and Affect: Mood normal.     ED Results /  Procedures / Treatments   Labs (all labs ordered are listed, but only abnormal results are displayed) Labs Reviewed  CBC WITH DIFFERENTIAL/PLATELET - Abnormal; Notable for the following components:      Result Value   MCH 34.7 (*)    All other components within normal limits  COMPREHENSIVE METABOLIC PANEL - Abnormal; Notable for the following components:   Sodium 131 (*)    Chloride 96 (*)    Glucose, Bld 128 (*)    BUN 7 (*)    Total Protein 8.3 (*)    AST 67 (*)    ALT 66 (*)    All other components within normal limits  URINALYSIS, ROUTINE W REFLEX MICROSCOPIC - Abnormal; Notable for the following components:   Color, Urine STRAW (*)    Leukocytes,Ua SMALL (*)    Bacteria, UA RARE (*)    All other components within normal limits  ETHANOL - Abnormal; Notable for the following components:   Alcohol, Ethyl (B) 44 (*)    All other components within normal  limits  LIPASE, BLOOD  PROTIME-INR  HIV ANTIBODY (ROUTINE TESTING W REFLEX)  HEPATIC FUNCTION PANEL  HEMOGLOBIN AND HEMATOCRIT, BLOOD  TYPE AND SCREEN  ABO/RH    EKG None  Radiology CT ABDOMEN PELVIS W CONTRAST  Result Date: 10/06/2022 CLINICAL DATA:  Epigastric pain with bloody emesis. EXAM: CT ABDOMEN AND PELVIS WITH CONTRAST TECHNIQUE: Multidetector CT imaging of the abdomen and pelvis was performed using the standard protocol following bolus administration of intravenous contrast. RADIATION DOSE REDUCTION: This exam was performed according to the departmental dose-optimization program which includes automated exposure control, adjustment of the mA and/or kV according to patient size and/or use of iterative reconstruction technique. CONTRAST:  10mL OMNIPAQUE IOHEXOL 350 MG/ML SOLN COMPARISON:  None Available. FINDINGS: Lower chest: Clear lung bases. No significant pleural or pericardial effusion. Possible mild distal esophageal wall thickening. Hepatobiliary: The liver is normal in density without suspicious focal abnormality. No evidence of gallstones, gallbladder wall thickening or biliary dilatation. Pancreas: Unremarkable. No pancreatic ductal dilatation or surrounding inflammatory changes. Spleen: Normal in size without focal abnormality. Adrenals/Urinary Tract: Both adrenal glands appear normal. No evidence of urinary tract calculus, suspicious renal lesion or hydronephrosis. The bladder appears unremarkable for its degree of distention. Stomach/Bowel: No enteric contrast administered. The stomach appears unremarkable for its degree of distension. No evidence of bowel wall thickening, distention or surrounding inflammatory change. Retrocecal appendix appears normal. Vascular/Lymphatic: There are no enlarged abdominal or pelvic lymph nodes. Aortic and branch vessel atherosclerosis without evidence of large vessel occlusion or aneurysm. The portal, superior mesenteric and splenic veins are  patent. Reproductive: The prostate gland and seminal vesicles appear unremarkable. Other: No evidence of abdominal wall mass or hernia. No ascites. Musculoskeletal: No acute or significant osseous findings. 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 IMPRESSION: 1. No acute findings or clear explanation for the patient's symptoms. 2. Possible mild distal esophageal wall thickening. 3. 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 4.  Aortic Atherosclerosis (ICD10-I70.0). Electronically Signed   By: Richardean Sale M.D.   On: 10/06/2022 14:17    Procedures Procedures    Medications Ordered in ED Medications  pantoprazole (PROTONIX) injection 40 mg (40 mg Intravenous Given 10/06/22 1644)  LORazepam (ATIVAN) tablet 1-4 mg (1 mg Oral Given 10/06/22 1825)    Or  LORazepam (ATIVAN) tablet 1 mg ( Oral See Alternative 10/06/22 1825)  thiamine (VITAMIN B1)  tablet 100 mg (100 mg Oral Given 10/06/22 1645)    Or  thiamine (VITAMIN B1) injection 100 mg ( Intravenous See Alternative 0/53/97 6734)  folic acid (FOLVITE) tablet 1 mg (1 mg Oral Given 10/06/22 1645)  multivitamin with minerals tablet 1 tablet (1 tablet Oral Given 10/06/22 1645)  ondansetron (ZOFRAN) injection 4 mg (4 mg Intravenous Given 10/06/22 1736)  hydrALAZINE (APRESOLINE) injection 5 mg (5 mg Intravenous Given 10/06/22 1736)  methadone (DOLOPHINE) tablet 40 mg (40 mg Oral Not Given 10/06/22 1737)  iohexol (OMNIPAQUE) 350 MG/ML injection 75 mL (75 mLs Intravenous Contrast Given 10/06/22 1410)    ED Course/ Medical Decision Making/ A&P Clinical Course as of 10/06/22 2117  Mon Oct 06, 2022  1606 GI doc eval for UGIB  Likely admit.  [CC]    Clinical Course User Index [CC] Tretha Sciara, MD                             Medical Decision Making Amount and/or Complexity of Data Reviewed Labs: ordered. Radiology: ordered.  Risk Prescription drug  management. Decision regarding hospitalization.   This patient presents to the ED for abdominal pain, this involves an extensive number of treatment options, and is a complaint that carries with a high risk of complications and morbidity.  The differential diagnosis includes aortic dissection, ACS/MI, enteritis, cholecystitis, GERD, pancreatitis, pneumonia, PUD.  This is not an exhaustive list.  Lab tests: I ordered and personally interpreted labs.  The pertinent results include: WBC unremarkable. Hbg unremarkable. Platelets unremarkable. No electrolyte abnormalities noted. BUN, creatinine unremarkable. AST, ALT elevated in the 60s. UA significant for bacteria and +leukocytes.  Imaging studies: I ordered imaging studies. I personally reviewed, interpreted imaging and agree with the radiologist's interpretations. The results include: CT abdomen pelvis does show mild distal esophageal wall thickening.  Problem list/ ED course/ Critical interventions/ Medical management: HPI: See above Vital signs within normal range and stable throughout visit. Laboratory/imaging studies significant for: See above. On physical examination, patient is afebrile and appears in no acute distress. Abdominal exam without peritoneal signs. No evidence of acute abdomen at this time. Well appearing. Given work up, low suspicion for acute hepatobiliary disease, acute pancreatitis (neg lipase), acute infectious processes (pneumonia, hepatitis, pyelonephritis), acute appendicitis, vascular catastrophe, bowel obstruction, viscus perforation, or testicular torsion, diverticulitis. Presentation not consistent with other acute, emergent causes of abdominal pain at this time.  Cannot rule out acute PUD. Nausea and pain medications offered however patient declined at this point.  I have reviewed the patient home medicines and have made adjustments as needed.  Cardiac monitoring/EKG: The patient was maintained on a cardiac monitor.   I personally reviewed and interpreted the cardiac monitor which showed an underlying rhythm of: sinus rhythm.  Additional history obtained: External records from outside source obtained and reviewed including: Chart review including previous notes, labs, imaging.  Consultations obtained: I requested consultation with GI specialist Azucena Freed PA-C, and discussed lab and imaging findings as well as pertinent plan.  She recommended admission to hospitalist with teaching service for observation and endoscopy tomorrow. I requested consultation with hospitalist Dr. Pearletha Forge, and discussed lab and imaging findings as well as pertinent plan. He recommended: admission.   Disposition Pt is admitted to the hospital for observation and endoscope tomorrow.   This chart was dictated using voice recognition software.  Despite best efforts to proofread,  errors can occur which can change the documentation meaning.  Final Clinical Impression(s) / ED Diagnoses Final diagnoses:  Hematemesis with nausea    Rx / DC Orders ED Discharge Orders     None         Jeanelle Malling, [place] 10/06/22 2117    Ernie Avena, MD 10/07/22 1121

## 2022-10-06 NOTE — H&P (Signed)
History and Physical    Caleb Hansen EUM:[id number] DOB: 1955/12/07 DOA: 10/06/2022  PCP: System, Provider Not In (Confirm with patient/family/NH records and if not entered, this has to be entered at [facility] point of entry) Patient coming from: Home  I have personally briefly reviewed patient's old medical records in [place]  Chief Complaint: vomiting of blood  HPI: Caleb Hansen is a 67 y.o. 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  Patient claimed to drink 2-3 beers a day and last drink was this morning.  Starting about 4 weeks ago patient developed episodic of epigastric pain worsening with eating and drinking, cramping-like subsided based on a few hours.  But he denies taking any NSAIDs or any pain meds for that matter.  This morning, he suddenly became nauseous and vomited 1 time large amount of dark-colored stomach content denies any chest pain shortness of breath lightheadedness.  She used to be on PPI for GERD but discontinued about 6 years ago.  ED Course: Borderline tachycardia, blood pressure significant elevated, CT abdomen pelvis showed distal esophagus inflammation, no signs of portal hypertension or varices seen on the CT.  INR 1.2, AST 66 ALT 67, albumin normal, INR 1.2, sodium 131.  Review of Systems: As per HPI otherwise 14 point review of systems negative.    Past Medical History:  Diagnosis Date   GERD (gastroesophageal reflux disease)    Hypertension 2023   Opiate dependence ([facility]) 09/2022   Previous snorting of heroin.  Maintained on methadone for years as of 09/2022    Past Surgical History:  Procedure Laterality Date   ELBOW SURGERY Left      reports that he has been smoking cigarettes. He has been smoking an average of .5 packs per day. He does not have any smokeless tobacco history on file. He reports current alcohol use.  He reports current drug use. Drug: Marijuana.  No Known Allergies  Family History  Problem Relation Age of Onset   Diabetes Mother    Hypertension Mother      Prior to Admission medications   Medication Sig Start Date End Date Taking? Authorizing Provider  cyclobenzaprine (FLEXERIL) 10 MG tablet Take 1 tablet (10 mg total) by mouth 2 (two) times daily as needed for muscle spasms. 08/10/14   Pisciotta, Elmyra Ricks, PA-C  lisinopril (PRINIVIL,ZESTRIL) 10 MG tablet Take 1 tablet (10 mg total) by mouth daily. 06/01/14   Lance Bosch, NP  naproxen sodium (ANAPROX) 220 MG tablet Take 660 mg by mouth 2 (two) times daily as needed (pain).     [provider]  traMADol (ULTRAM) 50 MG tablet Take 1 tablet (50 mg total) by mouth every 6 (six) hours as needed. 08/10/14   PisciottaElmyra Ricks, PA-C    Physical Exam: Vitals:   10/06/22 1150 10/06/22 1151 10/06/22 1648  BP: (!) 198/126  (!) 204/129  Pulse: (!) 104  79  Resp: 18  16  Temp: 98.1 F (36.7 C)  98.1 F (36.7 C)  TempSrc:   Oral  SpO2: 96%  98%  Weight:  59 kg   Height:  5\' 9"  (1.753 m)     Constitutional: NAD, calm, comfortable Vitals:   10/06/22 1150 10/06/22 1151 10/06/22 1648  BP: (!) 198/126  (!) 204/129  Pulse: (!) 104  79  Resp: 18  16  Temp: 98.1 F (36.7 C)  98.1 F (36.7  C)  TempSrc:   Oral  SpO2: 96%  98%  Weight:  59 kg   Height:  5\' 9"  (1.753 m)    Eyes: PERRL, lids and conjunctivae normal ENMT: Mucous membranes are moist. Posterior pharynx clear of any exudate or lesions.Normal dentition.  Neck: normal, supple, no masses, no thyromegaly Respiratory: clear to auscultation bilaterally, no wheezing, no crackles. Normal respiratory effort. No accessory muscle use.  Cardiovascular: Regular rate and rhythm, no murmurs / rubs / gallops. No extremity edema. 2+ pedal pulses. No carotid bruits.  Abdomen: no tenderness, no masses palpated. No hepatosplenomegaly. Bowel sounds positive.  Musculoskeletal: no  clubbing / cyanosis. No joint deformity upper and lower extremities. Good ROM, no contractures. Normal muscle tone.  Skin: no rashes, lesions, ulcers. No induration Neurologic: CN 2-12 grossly intact. Sensation intact, DTR normal. Strength 5/5 in all 4.  Psychiatric: Normal judgment and insight. Alert and oriented x 3. Normal mood.     Labs on Admission: I have personally reviewed following labs and imaging studies  CBC: Recent Labs  Lab 10/06/22 1227  WBC 7.0  NEUTROABS 3.2  HGB 15.8  HCT 44.5  MCV 97.8  PLT 238   Basic Metabolic Panel: Recent Labs  Lab 10/06/22 1227  NA 131*  K 3.5  CL 96*  CO2 23  GLUCOSE 128*  BUN 7*  CREATININE 1.02  CALCIUM 8.9   GFR: Estimated Creatinine Clearance: 61.1 mL/min (by C-G formula based on SCr of 1.02 mg/dL). Liver Function Tests: Recent Labs  Lab 10/06/22 1227  AST 67*  ALT 66*  ALKPHOS 52  BILITOT 0.6  PROT 8.3*  ALBUMIN 3.5   Recent Labs  Lab 10/06/22 1227  LIPASE 25   No results for input(s): "AMMONIA" in the last 168 hours. Coagulation Profile: Recent Labs  Lab 10/06/22 1410  INR 1.2   Cardiac Enzymes: No results for input(s): "CKTOTAL", "CKMB", "CKMBINDEX", "TROPONINI" in the last 168 hours. BNP (last 3 results) No results for input(s): "PROBNP" in the last 8760 hours. HbA1C: No results for input(s): "HGBA1C" in the last 72 hours. CBG: No results for input(s): "GLUCAP" in the last 168 hours. Lipid Profile: No results for input(s): "CHOL", "HDL", "LDLCALC", "TRIG", "CHOLHDL", "LDLDIRECT" in the last 72 hours. Thyroid Function Tests: No results for input(s): "TSH", "T4TOTAL", "FREET4", "T3FREE", "THYROIDAB" in the last 72 hours. Anemia Panel: No results for input(s): "VITAMINB12", "FOLATE", "FERRITIN", "TIBC", "IRON", "RETICCTPCT" in the last 72 hours. Urine analysis:    Component Value Date/Time   COLORURINE STRAW (A) 10/06/2022 1440   APPEARANCEUR CLEAR 10/06/2022 1440   LABSPEC 1.026 10/06/2022  1440   PHURINE 6.0 10/06/2022 1440   GLUCOSEU NEGATIVE 10/06/2022 1440   HGBUR NEGATIVE 10/06/2022 1440   BILIRUBINUR NEGATIVE 10/06/2022 1440   KETONESUR NEGATIVE 10/06/2022 1440   PROTEINUR NEGATIVE 10/06/2022 1440   NITRITE NEGATIVE 10/06/2022 1440   LEUKOCYTESUR SMALL (A) 10/06/2022 1440    Radiological Exams on Admission: CT ABDOMEN PELVIS W CONTRAST  Result Date: 10/06/2022 CLINICAL DATA:  Epigastric pain with bloody emesis. EXAM: CT ABDOMEN AND PELVIS WITH CONTRAST TECHNIQUE: Multidetector CT imaging of the abdomen and pelvis was performed using the standard protocol following bolus administration of intravenous contrast. RADIATION DOSE REDUCTION: This exam was performed according to the departmental dose-optimization program which includes automated exposure control, adjustment of the mA and/or kV according to patient size and/or use of iterative reconstruction technique. CONTRAST:  63mL OMNIPAQUE IOHEXOL 350 MG/ML SOLN COMPARISON:  None Available. FINDINGS: Lower chest: Clear lung bases.  No significant pleural or pericardial effusion. Possible mild distal esophageal wall thickening. Hepatobiliary: The liver is normal in density without suspicious focal abnormality. No evidence of gallstones, gallbladder wall thickening or biliary dilatation. Pancreas: Unremarkable. No pancreatic ductal dilatation or surrounding inflammatory changes. Spleen: Normal in size without focal abnormality. Adrenals/Urinary Tract: Both adrenal glands appear normal. No evidence of urinary tract calculus, suspicious renal lesion or hydronephrosis. The bladder appears unremarkable for its degree of distention. Stomach/Bowel: No enteric contrast administered. The stomach appears unremarkable for its degree of distension. No evidence of bowel wall thickening, distention or surrounding inflammatory change. Retrocecal appendix appears normal. Vascular/Lymphatic: There are no enlarged abdominal or pelvic lymph nodes. Aortic  and branch vessel atherosclerosis without evidence of large vessel occlusion or aneurysm. The portal, superior mesenteric and splenic veins are patent. Reproductive: The prostate gland and seminal vesicles appear unremarkable. Other: No evidence of abdominal wall mass or hernia. No ascites. Musculoskeletal: No acute or significant osseous findings. 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 IMPRESSION: 1. No acute findings or clear explanation for the patient's symptoms. 2. Possible mild distal esophageal wall thickening. 3. 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 4.  Aortic Atherosclerosis (ICD10-I70.0). Electronically Signed   By: Richardean Sale M.D.   On: 10/06/2022 14:17    EKG: ordered  Assessment/Plan Principal Problem:   GI bleed Active Problems:   Hematemesis without nausea   Gastroesophageal reflux disease   HTN (hypertension)  (please populate well all problems here in Problem List. (For example, if patient is on BP meds at home and you resume or decide to hold them, it is a problem that needs to be her. Same for CAD, COPD, HLD and so on)  Hematemesis -Mallory-Weiss versus peptic ulcer bleeding, portal hypertension and varices bleeding less likely given no significant image evidence and relatively normal liver synthetic function and no cirrhosis on CT abdomen -H/H tonight and in AM, no indication for transfusion at this point -PPI IV twice daily -Hold off Sandostatin -GI consulted, EGD in the morning, n.p.o. after midnight  Acute probably on chronic transaminitis -Likely secondary to alcohol abuse -Trend LFT tomorrow, outpatient follow-up with GI.  Methadone therapy -Continue Methadone  HTN, uncontrolled -PRN Hydralazine for now, until able to resume PO.  Hyponatremia -Euvolemic, likely secondary to beer potomania -Fluid restriction  EtOH abuse -No symptoms signs of active  withdrawal -CIWA protocol with as needed benzos  DVT prophylaxis: SCD Code Status: Full code Family Communication: None at bedside Disposition Plan: Patient sick with upper GI bleed, requiring inpatient GI workup and EGD, expect more than 2 midnight hospital stay Consults called: [facility] Admission status: Telemetry admission   Lequita Halt MD [facility] Pager [phone]  10/06/2022, 5:04 PM

## 2022-10-06 NOTE — ED Notes (Signed)
Pt states that he went to bathroom and vomited after med administration. MD Roosevelt Locks made aware.

## 2022-10-06 NOTE — TOC CAGE-AID Note (Signed)
Transition of Care [facility]) - CAGE-AID Screening   Patient Details  Name: Caleb Hansen MRN: [id number] Date of Birth: 06-16-1956  [facility]) CM/SW Contact:    Jennylee Uehara C Tarpley-Carter, [place] Phone Number: 10/06/2022, 4:57 PM   Clinical Narrative: Pt participated in [place].  Pt stated he does use ETOH.  Pt was offered resources, due to usage of ETOH.   Pt currently go to [facility] for outpt assistance.  Dasie Chancellor Tarpley-Carter, MSW, LCSW-A Pronouns:  She/Her/Hers [facility] Health[facility]itions of Care Clinical Social Worker Direct Number:  [phone] [email or web address]   CAGE-AID Screening:    Have You Ever Felt You Ought to Cut Down on Your Drinking or Drug Use?: Yes Have People Annoyed You By [employer] Your Drinking Or Drug Use?: No Have You Felt Bad Or Guilty About Your Drinking Or Drug Use?: No Have You Ever Had a Drink or Used Drugs First Thing In The Morning to Steady Your Nerves or to Get Rid of a Hangover?: No CAGE-AID Score: 1  Substance Abuse Education Offered: Yes  Substance abuse interventions: Scientist, clinical (histocompatibility and immunogenetics)

## 2022-10-06 NOTE — ED Triage Notes (Addendum)
Pt endorses 1 episode hematemesis this am; pt denies being on blood thinners; endorses epigastric burning x several months; denies bloody stools; pt drinks 3-4 40 oz beers/day; pt on lisinopril, has not taken today

## 2022-10-06 NOTE — Consult Note (Addendum)
[facility] Consult: 4:23 PM 10/06/2022  LOS: 0 days    Referring Provider: Jeanelle Malling PA-c.    Primary Care Physician:  Dr Lonia Blood at [facility] in [place]. Primary Gastroenterologist:  none.  Unassigned.      Reason for Consultation: Single episode hematemesis.   HPI: Caleb Hansen is a 67 y.o. male.  PMH hypertension.  GERD.  Opiate use disorder, previously snorted heroin.  Now on methadone maintenance.  Previous negative testing for hepatitis B and C during a period of incarceration   Patient had EGD and colonoscopy in [place] 10 to 15 years ago.  He was having reflux symptoms and the upper endoscopy, per his report was unremarkable but he had been on PPI for some time leading up to the EGD.  Colonoscopy was unrevealing, patient does not recall diverticulosis, colon polyps or colitis.   No longer on PPI.    No longer using PPI, previously was on Prilosec OTC.  For a couple of months patient has had episodes where he swallows the methadone and then vomits it up quickly.  This happens a couple of times a week especially if he takes consuming methadone on an empty stomach.  Generally does not have nausea otherwise.  Rare liquid dysphagia where it feels like liquids have gone down the wrong pipe and he coughs.  No sense that food or liquids are getting stuck in his esophagus.  No ASA, no NSAIDs.  This morning he woke up, was nauseous and proceeded to have an episode of initially pinkish but then deep red emesis.  This was moderate volume emesis and it occurred once and has not repeated itself.  Has some epigastric discomfort which is not severe. Also reports starting on lisinopril a few months ago but he has not been back to have recheck of hypertension.  In the ED blood pressure 198/126.  Heart rate 104,  room air sats 96%.  No fever  Hgb 15.8, a year ago it was 15.  MCV 97.  Platelets 238.  WBCs normal.  INR 1.2.   Na 131.  Glucose 128.  BUN is actually low at 7, creatinine normal.  AST/ALT 67/66 not quite twice upper limit normal T. bili and alk phos normal.  Lipase normal 25. CTAP w contrast: Possible mild, distal esophageal wall thickening but no clear findings to explain epigastric pain, hematemesis.  Advanced lumbosacral spondylosis.  Aortic atherosclerosis.  Liver, gallbladder, pancreas, pancreatic and biliary ducts unremarkable.  Patient is unemployed.  He has a significant other/girlfriend.  Drinks 2 40 ounce beers/malt liquor daily.  Family history pertinent for pancreatic cancer in a grandfather, breast cancer in an aunt.  Complicated diabetes in her brother who died from complications of diabetes.  Grandmother had strokes.  No family history of colon cancer, peptic ulcer disease, GI bleeding, anemia.  Past Medical History:  Diagnosis Date   GERD (gastroesophageal reflux disease)    Hypertension 2023   Opiate dependence (HCC) 09/2022   Previous snorting of heroin.  Maintained on methadone for years as of 09/2022  Past Surgical History:  Procedure Laterality Date   ELBOW SURGERY Left     Prior to Admission medications   Medication Sig Start Date End Date Taking? Authorizing Provider  cyclobenzaprine (FLEXERIL) 10 MG tablet Take 1 tablet (10 mg total) by mouth 2 (two) times daily as needed for muscle spasms. 08/10/14   Pisciotta, Joni Reining, PA-C  lisinopril (PRINIVIL,ZESTRIL) 10 MG tablet Take 1 tablet (10 mg total) by mouth daily. 06/01/14   Ambrose Finland, NP  naproxen sodium (ANAPROX) 220 MG tablet Take 660 mg by mouth 2 (two) times daily as needed (pain).     [provider]  traMADol (ULTRAM) 50 MG tablet Take 1 tablet (50 mg total) by mouth every 6 (six) hours as needed. 08/10/14   Pisciotta, Joni Reining, PA-C    Scheduled Meds:  Infusions:  PRN  Meds:    Allergies as of 10/06/2022   (No Known Allergies)    Family History  Problem Relation Age of Onset   Diabetes Mother    Hypertension Mother     Social History   Socioeconomic History   Marital status: Single    Spouse name: Not on file   Number of children: Not on file   Years of education: Not on file   Highest education level: Not on file  Occupational History   Not on file  Tobacco Use   Smoking status: Every Day    Packs/day: 0.50    Types: Cigarettes   Smokeless tobacco: Not on file  Substance and Sexual Activity   Alcohol use: Yes    Comment: 3-40oz beer / day   Drug use: Yes    Types: Marijuana   Sexual activity: Not on file  Other Topics Concern   Not on file  Social History Narrative   Not on file   Social Determinants of Health   Financial Resource Strain: Not on file  Food Insecurity: Not on file  Transportation Needs: Not on file  Physical Activity: Not on file  Stress: Not on file  Social Connections: Not on file  Intimate Partner Violence: Not on file    REVIEW OF SYSTEMS: Constitutional: No profound weakness or fatigue. ENT:  No nose bleeds Pulm: No cough or shortness of breath CV:  No palpitations, no LE edema.  No angina. GU:  No hematuria, no frequency GI: Per HPI. Heme: Denies unusual or excessive bleeding or bruising. Transfusions: None Neuro:  No headaches, no peripheral tingling or numbness Derm:  No itching, no rash or sores.  Endocrine:  No sweats or chills.  No polyuria or dysuria Immunization: The only vaccination record is a flu shot in 2015. Travel: Not queried.  PHYSICAL EXAM: Vital signs in last 24 hours: Vitals:   10/06/22 1150  BP: (!) 198/126  Pulse: (!) 104  Resp: 18  Temp: 98.1 F (36.7 C)  SpO2: 96%   Wt Readings from Last 3 Encounters:  10/06/22 59 kg  11/06/20 65 kg  06/01/14 64.4 kg    General: Thin but not cachectic appearing.  Looks good for age 44.  Alert, comfortable,  nonill-appearing Head: No facial asymmetry or swelling.  No signs of head trauma. Eyes: No conjunctival pallor or scleral icterus.  EOMI Ears: No hearing deficit Nose: No congestion or discharge. Mouth: Moist, clear, pink mucosa.  Fair dentition.  Tongue midline.  Halitosis. Neck: No JVD, no masses, no thyromegaly Lungs: Clear bilaterally with good breath sounds.  No labored breathing.  No cough Heart: RRR.  No MRG.  S1, S2 present Abdomen: Not tender or distended.  No HSM, masses, bruits, hernias..   Rectal: Deferred Musc/Skeltl: No joint redness, swelling or gross deformity. Extremities: No CCE. Neurologic: Fully alert.  Oriented x 3.  Moves all 4 limbs without gross deficit or tremors.  Formal strength testing not performed. Skin: No rash, no telangiectasia, no suspicious lesions, no sores. Nodes: No cervical adenopathy Psych: Cooperative, calm, pleasant.  Became animated at the idea of having to spend the night at the hospital.  Intake/Output from previous day: No intake/output data recorded. Intake/Output this shift: No intake/output data recorded.  LAB RESULTS: Recent Labs    10/06/22 1227  WBC 7.0  HGB 15.8  HCT 44.5  PLT 238   BMET Lab Results  Component Value Date   NA 131 (L) 10/06/2022   NA 135 11/06/2020   K 3.5 10/06/2022   K 4.0 11/06/2020   CL 96 (L) 10/06/2022   CL 98 11/06/2020   CO2 23 10/06/2022   CO2 25 11/06/2020   GLUCOSE 128 (H) 10/06/2022   GLUCOSE 96 11/06/2020   BUN 7 (L) 10/06/2022   BUN 10 11/06/2020   CREATININE 1.02 10/06/2022   CREATININE 0.66 11/06/2020   CALCIUM 8.9 10/06/2022   CALCIUM 9.1 11/06/2020   LFT Recent Labs    10/06/22 1227  PROT 8.3*  ALBUMIN 3.5  AST 67*  ALT 66*  ALKPHOS 52  BILITOT 0.6   PT/INR Lab Results  Component Value Date   INR 1.2 10/06/2022   Hepatitis Panel No results for input(s): "HEPBSAG", "HCVAB", "HEPAIGM", "HEPBIGM" in the last 72 hours. C-Diff No components found for:  "CDIFF" Lipase     Component Value Date/Time   LIPASE 25 10/06/2022 1227    Drugs of Abuse  No results found for: "LABOPIA", "COCAINSCRNUR", "LABBENZ", "AMPHETMU", "THCU", "LABBARB"   RADIOLOGY STUDIES: CT ABDOMEN PELVIS W CONTRAST  Result Date: 10/06/2022 CLINICAL DATA:  Epigastric pain with bloody emesis. EXAM: CT ABDOMEN AND PELVIS WITH CONTRAST TECHNIQUE: Multidetector CT imaging of the abdomen and pelvis was performed using the standard protocol following bolus administration of intravenous contrast. RADIATION DOSE REDUCTION: This exam was performed according to the departmental dose-optimization program which includes automated exposure control, adjustment of the mA and/or kV according to patient size and/or use of iterative reconstruction technique. CONTRAST:  74mL OMNIPAQUE IOHEXOL 350 MG/ML SOLN COMPARISON:  None Available. FINDINGS: Lower chest: Clear lung bases. No significant pleural or pericardial effusion. Possible mild distal esophageal wall thickening. Hepatobiliary: The liver is normal in density without suspicious focal abnormality. No evidence of gallstones, gallbladder wall thickening or biliary dilatation. Pancreas: Unremarkable. No pancreatic ductal dilatation or surrounding inflammatory changes. Spleen: Normal in size without focal abnormality. Adrenals/Urinary Tract: Both adrenal glands appear normal. No evidence of urinary tract calculus, suspicious renal lesion or hydronephrosis. The bladder appears unremarkable for its degree of distention. Stomach/Bowel: No enteric contrast administered. The stomach appears unremarkable for its degree of distension. No evidence of bowel wall thickening, distention or surrounding inflammatory change. Retrocecal appendix appears normal. Vascular/Lymphatic: There are no enlarged abdominal or pelvic lymph nodes. Aortic and branch vessel atherosclerosis without evidence of large vessel occlusion or aneurysm. The portal, superior mesenteric and  splenic veins are patent. Reproductive: The prostate gland and seminal vesicles appear unremarkable. Other: No evidence of abdominal wall mass or hernia. No ascites. Musculoskeletal: No acute or significant osseous findings. 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 IMPRESSION: 1. No acute findings or  clear explanation for the patient's symptoms. 2. Possible mild distal esophageal wall thickening. 3. 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 4.  Aortic Atherosclerosis (ICD10-I70.0). Electronically Signed   By: Richardean Sale M.D.   On: 10/06/2022 14:17      IMPRESSION:     Hematemesis wo anemia or hypotension.  R/O erosive esophagitis/GERD.  R/O MWT.  R/O PUD.    Hypertension.  Hyponatremia.  Mild elevation transaminases.  No liver/biliary issues on CT scan.    ETOH use disorder.  Consumes about 80 ounces beer/malt liquor daily.  Opiate use disorder.  Previously smoked heroin.  Remote history injection as a teenager.  Now on methadone maintenance for several years.    PLAN:       EGD tmrw noon.  Okay for clear liquids tonight.  Protonix 40 mg IV twice daily ordered.  Hospitalist to admit for obs.     Azucena Freed  10/06/2022, 4:23 PM Phone [phone]

## 2022-10-06 NOTE — ED Provider Triage Note (Signed)
Emergency Medicine Provider Triage Evaluation Note  Caleb Hansen , a 67 y.o. male  was evaluated in triage.  Pt complains of intermittent abdominal pain since November.  He reports that in a the last couple days the pain has gotten worse.  This morning he has 1 episode of vomiting with dark bloody emesis.  No fever, constipation or diarrhea, blood in stool or urine.  Review of Systems  Positive: As above Negative: As above  Physical Exam  BP (!) 198/126   Pulse (!) 104   Temp 98.1 F (36.7 C)   Resp 18   Ht 5\' 9"  (1.753 m)   Wt 59 kg   SpO2 96%   BMI 19.20 kg/m  Gen:   Awake, no distress   Resp:  Normal effort  MSK:   Moves extremities without difficulty  Other:  TTP to epigastrium  Medical Decision Making  Medically screening exam initiated at 12:15 PM.  Appropriate orders placed.  Dashel Zani was informed that the remainder of the evaluation will be completed by another provider, this initial triage assessment does not replace that evaluation, and the importance of remaining in the ED until their evaluation is complete.     Rex Kras, [place] 10/06/22 2116

## 2022-10-06 NOTE — H&P (View-Only) (Signed)
[facility] Consult: 4:23 PM 10/06/2022  LOS: 0 days    Referring Provider: Jeanelle Malling PA-c.    Primary Care Physician:  Dr Lonia Blood at [facility] in [place]. Primary Gastroenterologist:  none.  Unassigned.      Reason for Consultation: Single episode hematemesis.   HPI: Caleb Hansen is a 67 y.o. male.  PMH hypertension.  GERD.  Opiate use disorder, previously snorted heroin.  Now on methadone maintenance.  Previous negative testing for hepatitis B and C during a period of incarceration   Patient had EGD and colonoscopy in  10 to 15 years ago.  He was having reflux symptoms and the upper endoscopy, per his report was unremarkable but he had been on PPI for some time leading up to the EGD.  Colonoscopy was unrevealing, patient does not recall diverticulosis, colon polyps or colitis.   No longer on PPI.    No longer using PPI, previously was on Prilosec OTC.  For a couple of months patient has had episodes where he swallows the methadone and then vomits it up quickly.  This happens a couple of times a week especially if he takes consuming methadone on an empty stomach.  Generally does not have nausea otherwise.  Rare liquid dysphagia where it feels like liquids have gone down the wrong pipe and he coughs.  No sense that food or liquids are getting stuck in his esophagus.  No ASA, no NSAIDs.  This morning he woke up, was nauseous and proceeded to have an episode of initially pinkish but then deep red emesis.  This was moderate volume emesis and it occurred once and has not repeated itself.  Has some epigastric discomfort which is not severe. Also reports starting on lisinopril a few months ago but he has not been back to have recheck of hypertension.  In the ED blood pressure 198/126.  Heart rate 104,  room air sats 96%.  No fever  Hgb 15.8, a year ago it was 15.  MCV 97.  Platelets 238.  WBCs normal.  INR 1.2.   Na 131.  Glucose 128.  BUN is actually low at 7, creatinine normal.  AST/ALT 67/66 not quite twice upper limit normal T. bili and alk phos normal.  Lipase normal 25. CTAP w contrast: Possible mild, distal esophageal wall thickening but no clear findings to explain epigastric pain, hematemesis.  Advanced lumbosacral spondylosis.  Aortic atherosclerosis.  Liver, gallbladder, pancreas, pancreatic and biliary ducts unremarkable.  Patient is unemployed.  He has a significant other/girlfriend.  Drinks 2 40 ounce beers/malt liquor daily.  Family history pertinent for pancreatic cancer in a grandfather, breast cancer in an aunt.  Complicated diabetes in her brother who died from complications of diabetes.  Grandmother had strokes.  No family history of colon cancer, peptic ulcer disease, GI bleeding, anemia.  Past Medical History:  Diagnosis Date   GERD (gastroesophageal reflux disease)    Hypertension 2023   Opiate dependence (HCC) 09/2022   Previous snorting of heroin.  Maintained on methadone for years as of 09/2022  Past Surgical History:  Procedure Laterality Date   ELBOW SURGERY Left     Prior to Admission medications   Medication Sig Start Date End Date Taking? Authorizing Provider  cyclobenzaprine (FLEXERIL) 10 MG tablet Take 1 tablet (10 mg total) by mouth 2 (two) times daily as needed for muscle spasms. 08/10/14   Pisciotta, Joni Reining, PA-C  lisinopril (PRINIVIL,ZESTRIL) 10 MG tablet Take 1 tablet (10 mg total) by mouth daily. 06/01/14   Ambrose Finland, NP  naproxen sodium (ANAPROX) 220 MG tablet Take 660 mg by mouth 2 (two) times daily as needed (pain).     [provider]  traMADol (ULTRAM) 50 MG tablet Take 1 tablet (50 mg total) by mouth every 6 (six) hours as needed. 08/10/14   Pisciotta, Joni Reining, PA-C    Scheduled Meds:  Infusions:  PRN  Meds:    Allergies as of 10/06/2022   (No Known Allergies)    Family History  Problem Relation Age of Onset   Diabetes Mother    Hypertension Mother     Social History   Socioeconomic History   Marital status: Single    Spouse name: Not on file   Number of children: Not on file   Years of education: Not on file   Highest education level: Not on file  Occupational History   Not on file  Tobacco Use   Smoking status: Every Day    Packs/day: 0.50    Types: Cigarettes   Smokeless tobacco: Not on file  Substance and Sexual Activity   Alcohol use: Yes    Comment: 3-40oz beer / day   Drug use: Yes    Types: Marijuana   Sexual activity: Not on file  Other Topics Concern   Not on file  Social History Narrative   Not on file   Social Determinants of Health   Financial Resource Strain: Not on file  Food Insecurity: Not on file  Transportation Needs: Not on file  Physical Activity: Not on file  Stress: Not on file  Social Connections: Not on file  Intimate Partner Violence: Not on file    REVIEW OF SYSTEMS: Constitutional: No profound weakness or fatigue. ENT:  No nose bleeds Pulm: No cough or shortness of breath CV:  No palpitations, no LE edema.  No angina. GU:  No hematuria, no frequency GI: Per HPI. Heme: Denies unusual or excessive bleeding or bruising. Transfusions: None Neuro:  No headaches, no peripheral tingling or numbness Derm:  No itching, no rash or sores.  Endocrine:  No sweats or chills.  No polyuria or dysuria Immunization: The only vaccination record is a flu shot in 2015. Travel: Not queried.  PHYSICAL EXAM: Vital signs in last 24 hours: Vitals:   10/06/22 1150  BP: (!) 198/126  Pulse: (!) 104  Resp: 18  Temp: 98.1 F (36.7 C)  SpO2: 96%   Wt Readings from Last 3 Encounters:  10/06/22 59 kg  11/06/20 65 kg  06/01/14 64.4 kg    General: Thin but not cachectic appearing.  Looks good for age 44.  Alert, comfortable,  nonill-appearing Head: No facial asymmetry or swelling.  No signs of head trauma. Eyes: No conjunctival pallor or scleral icterus.  EOMI Ears: No hearing deficit Nose: No congestion or discharge. Mouth: Moist, clear, pink mucosa.  Fair dentition.  Tongue midline.  Halitosis. Neck: No JVD, no masses, no thyromegaly Lungs: Clear bilaterally with good breath sounds.  No labored breathing.  No cough Heart: RRR.  No MRG.  S1, S2 present Abdomen: Not tender or distended.  No HSM, masses, bruits, hernias..   Rectal: Deferred Musc/Skeltl: No joint redness, swelling or gross deformity. Extremities: No CCE. Neurologic: Fully alert.  Oriented x 3.  Moves all 4 limbs without gross deficit or tremors.  Formal strength testing not performed. Skin: No rash, no telangiectasia, no suspicious lesions, no sores. Nodes: No cervical adenopathy Psych: Cooperative, calm, pleasant.  Became animated at the idea of having to spend the night at the hospital.  Intake/Output from previous day: No intake/output data recorded. Intake/Output this shift: No intake/output data recorded.  LAB RESULTS: Recent Labs    10/06/22 1227  WBC 7.0  HGB 15.8  HCT 44.5  PLT 238   BMET Lab Results  Component Value Date   NA 131 (L) 10/06/2022   NA 135 11/06/2020   K 3.5 10/06/2022   K 4.0 11/06/2020   CL 96 (L) 10/06/2022   CL 98 11/06/2020   CO2 23 10/06/2022   CO2 25 11/06/2020   GLUCOSE 128 (H) 10/06/2022   GLUCOSE 96 11/06/2020   BUN 7 (L) 10/06/2022   BUN 10 11/06/2020   CREATININE 1.02 10/06/2022   CREATININE 0.66 11/06/2020   CALCIUM 8.9 10/06/2022   CALCIUM 9.1 11/06/2020   LFT Recent Labs    10/06/22 1227  PROT 8.3*  ALBUMIN 3.5  AST 67*  ALT 66*  ALKPHOS 52  BILITOT 0.6   PT/INR Lab Results  Component Value Date   INR 1.2 10/06/2022   Hepatitis Panel No results for input(s): "HEPBSAG", "HCVAB", "HEPAIGM", "HEPBIGM" in the last 72 hours. C-Diff No components found for:  "CDIFF" Lipase     Component Value Date/Time   LIPASE 25 10/06/2022 1227    Drugs of Abuse  No results found for: "LABOPIA", "COCAINSCRNUR", "LABBENZ", "AMPHETMU", "THCU", "LABBARB"   RADIOLOGY STUDIES: CT ABDOMEN PELVIS W CONTRAST  Result Date: 10/06/2022 CLINICAL DATA:  Epigastric pain with bloody emesis. EXAM: CT ABDOMEN AND PELVIS WITH CONTRAST TECHNIQUE: Multidetector CT imaging of the abdomen and pelvis was performed using the standard protocol following bolus administration of intravenous contrast. RADIATION DOSE REDUCTION: This exam was performed according to the departmental dose-optimization program which includes automated exposure control, adjustment of the mA and/or kV according to patient size and/or use of iterative reconstruction technique. CONTRAST:  74mL OMNIPAQUE IOHEXOL 350 MG/ML SOLN COMPARISON:  None Available. FINDINGS: Lower chest: Clear lung bases. No significant pleural or pericardial effusion. Possible mild distal esophageal wall thickening. Hepatobiliary: The liver is normal in density without suspicious focal abnormality. No evidence of gallstones, gallbladder wall thickening or biliary dilatation. Pancreas: Unremarkable. No pancreatic ductal dilatation or surrounding inflammatory changes. Spleen: Normal in size without focal abnormality. Adrenals/Urinary Tract: Both adrenal glands appear normal. No evidence of urinary tract calculus, suspicious renal lesion or hydronephrosis. The bladder appears unremarkable for its degree of distention. Stomach/Bowel: No enteric contrast administered. The stomach appears unremarkable for its degree of distension. No evidence of bowel wall thickening, distention or surrounding inflammatory change. Retrocecal appendix appears normal. Vascular/Lymphatic: There are no enlarged abdominal or pelvic lymph nodes. Aortic and branch vessel atherosclerosis without evidence of large vessel occlusion or aneurysm. The portal, superior mesenteric and  splenic veins are patent. Reproductive: The prostate gland and seminal vesicles appear unremarkable. Other: No evidence of abdominal wall mass or hernia. No ascites. Musculoskeletal: No acute or significant osseous findings. 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 IMPRESSION: 1. No acute findings or  clear explanation for the patient's symptoms. 2. Possible mild distal esophageal wall thickening. 3. 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 4.  Aortic Atherosclerosis (ICD10-I70.0). Electronically Signed   By: Richardean Sale M.D.   On: 10/06/2022 14:17      IMPRESSION:     Hematemesis wo anemia or hypotension.  R/O erosive esophagitis/GERD.  R/O MWT.  R/O PUD.    Hypertension.  Hyponatremia.  Mild elevation transaminases.  No liver/biliary issues on CT scan.    ETOH use disorder.  Consumes about 80 ounces beer/malt liquor daily.  Opiate use disorder.  Previously smoked heroin.  Remote history injection as a teenager.  Now on methadone maintenance for several years.    PLAN:       EGD tmrw noon.  Okay for clear liquids tonight.  Protonix 40 mg IV twice daily ordered.  Hospitalist to admit for obs.     Azucena Freed  10/06/2022, 4:23 PM Phone [phone]

## 2022-10-07 ENCOUNTER — Encounter (HOSPITAL_COMMUNITY)
Admission: EM | Disposition: A | Discharge status: Home / Self Care | Payer: Self-pay | Source: Home / Self Care | Attending: Internal Medicine

## 2022-10-07 ENCOUNTER — Other Ambulatory Visit (HOSPITAL_COMMUNITY): Payer: Self-pay

## 2022-10-07 ENCOUNTER — Inpatient Hospital Stay (HOSPITAL_COMMUNITY): Payer: Medicaid Other | Admitting: Anesthesiology

## 2022-10-07 ENCOUNTER — Encounter (HOSPITAL_COMMUNITY): Payer: Self-pay | Admitting: Internal Medicine

## 2022-10-07 DIAGNOSIS — K2210 Ulcer of esophagus without bleeding: Secondary | ICD-10-CM

## 2022-10-07 DIAGNOSIS — K5520 Angiodysplasia of colon without hemorrhage: Secondary | ICD-10-CM

## 2022-10-07 DIAGNOSIS — I10 Essential (primary) hypertension: Secondary | ICD-10-CM | POA: Diagnosis not present

## 2022-10-07 DIAGNOSIS — K2101 Gastro-esophageal reflux disease with esophagitis, with bleeding: Secondary | ICD-10-CM | POA: Diagnosis not present

## 2022-10-07 DIAGNOSIS — K920 Hematemesis: Secondary | ICD-10-CM

## 2022-10-07 DIAGNOSIS — K31819 Angiodysplasia of stomach and duodenum without bleeding: Secondary | ICD-10-CM

## 2022-10-07 DIAGNOSIS — F17210 Nicotine dependence, cigarettes, uncomplicated: Secondary | ICD-10-CM

## 2022-10-07 HISTORY — PX: HOT HEMOSTASIS: SHX5433

## 2022-10-07 HISTORY — PX: ESOPHAGOGASTRODUODENOSCOPY (EGD) WITH PROPOFOL: SHX5813

## 2022-10-07 LAB — HEPATIC FUNCTION PANEL
ALT: 61 U/L — ABNORMAL HIGH (ref 0–44)
AST: 63 U/L — ABNORMAL HIGH (ref 15–41)
Albumin: 3.4 g/dL — ABNORMAL LOW (ref 3.5–5.0)
Alkaline Phosphatase: 51 U/L (ref 38–126)
Bilirubin, Direct: 0.2 mg/dL (ref 0.0–0.2)
Indirect Bilirubin: 0.6 mg/dL (ref 0.3–0.9)
Total Bilirubin: 0.8 mg/dL (ref 0.3–1.2)
Total Protein: 8.4 g/dL — ABNORMAL HIGH (ref 6.5–8.1)

## 2022-10-07 LAB — CBC
HCT: 45.9 % (ref 39.0–52.0)
Hemoglobin: 16 g/dL (ref 13.0–17.0)
MCH: 34.7 pg — ABNORMAL HIGH (ref 26.0–34.0)
MCHC: 34.9 g/dL (ref 30.0–36.0)
MCV: 99.6 fL (ref 80.0–100.0)
Platelets: 216 10*3/uL (ref 150–400)
RBC: 4.61 MIL/uL (ref 4.22–5.81)
RDW: 12.3 % (ref 11.5–15.5)
WBC: 6.1 10*3/uL (ref 4.0–10.5)
nRBC: 0 % (ref 0.0–0.2)

## 2022-10-07 LAB — BASIC METABOLIC PANEL
Anion gap: 11 (ref 5–15)
BUN: 9 mg/dL (ref 8–23)
CO2: 28 mmol/L (ref 22–32)
Calcium: 8.9 mg/dL (ref 8.9–10.3)
Chloride: 96 mmol/L — ABNORMAL LOW (ref 98–111)
Creatinine, Ser: 0.98 mg/dL (ref 0.61–1.24)
GFR, Estimated: >60 mL/min (ref >60)
Glucose, Bld: 99 mg/dL (ref 70–99)
Potassium: 3.7 mmol/L (ref 3.5–5.1)
Sodium: 135 mmol/L (ref 135–145)

## 2022-10-07 SURGERY — ESOPHAGOGASTRODUODENOSCOPY (EGD) WITH PROPOFOL
Anesthesia: Monitor Anesthesia Care

## 2022-10-07 MED ORDER — PANTOPRAZOLE SODIUM 40 MG PO TBEC
40.0000 mg | DELAYED_RELEASE_TABLET | Freq: Every day | ORAL | 1 refills | Status: AC
  Filled 2022-10-07: qty 30, 30d supply, fill #0

## 2022-10-07 MED ORDER — PROPOFOL 500 MG/50ML IV EMUL
INTRAVENOUS | Status: DC | PRN
  Administered 2022-10-07: 125 ug/kg/min via INTRAVENOUS

## 2022-10-07 MED ORDER — LISINOPRIL 10 MG PO TABS
10.0000 mg | ORAL_TABLET | Freq: Every day | ORAL | Status: DC
  Administered 2022-10-07: 10 mg via ORAL
  Filled 2022-10-07: qty 1

## 2022-10-07 MED ORDER — LIDOCAINE 2% (20 MG/ML) 5 ML SYRINGE
INTRAMUSCULAR | Status: DC | PRN
  Administered 2022-10-07: 100 mg via INTRAVENOUS

## 2022-10-07 MED ORDER — TRAMADOL HCL 50 MG PO TABS: 50.0000 mg | ORAL_TABLET | Freq: Four times a day (QID) | ORAL | Status: DC | PRN

## 2022-10-07 MED ORDER — LACTATED RINGERS IV SOLN: INTRAVENOUS | Status: DC

## 2022-10-07 MED ORDER — HYDRALAZINE HCL 20 MG/ML IJ SOLN: 5.0000 mg | INTRAMUSCULAR | Status: DC | PRN

## 2022-10-07 MED ORDER — PROPOFOL 10 MG/ML IV BOLUS
INTRAVENOUS | Status: DC | PRN
  Administered 2022-10-07: 60 mg via INTRAVENOUS

## 2022-10-07 SURGICAL SUPPLY — 15 items

## 2022-10-07 NOTE — Interval H&P Note (Signed)
History and Physical Interval Note: Patient without further bleeding episodes. On PPI. Denies complaints this AM. Have discussed risks / benefits of EGD and anesthesia with him and he wants to proceed. He is anxious to go home but agrees to have procedure done first to further evaluate and risk stratify his risk for rebleeding.    10/07/2022 9:24 AM  Caleb Hansen  has presented today for surgery, with the diagnosis of Hematemesis this morning.  2 months of nausea, vomiting after taking methadone..  The various methods of treatment have been discussed with the patient and family. After consideration of risks, benefits and other options for treatment, the patient has consented to  Procedure(s): ESOPHAGOGASTRODUODENOSCOPY (EGD) WITH PROPOFOL (N/A) as a surgical intervention.  The patient's history has been reviewed, patient examined, no change in status, stable for surgery.  I have reviewed the patient's chart and labs.  Questions were answered to the patient's satisfaction.     Mart

## 2022-10-07 NOTE — Discharge Summary (Signed)
Physician Discharge Summary  Caleb Hansen VOJ:[id number] DOB: 06/23/1956 DOA: 10/06/2022  PCP: System, Provider Not In  Admit date: 10/06/2022 Discharge date: 10/07/2022  Admitted From: home Discharge disposition: home   Recommendations for Outpatient Follow-Up:   Alcohol cessation   Discharge Diagnosis:   Principal Problem:   GI bleed Active Problems:   Hematemesis   Gastroesophageal reflux disease   HTN (hypertension)   AVM (arteriovenous malformation) of small bowel, acquired    Discharge Condition: Improved.  Diet recommendation: Low sodium, heart healthy.    Wound care: None.  Code status: Full.   History of Present Illness:   Caleb Hansen is a 67 y.o. 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   Patient claimed to drink 2-3 beers a day and last drink was this morning.  Starting about 4 weeks ago patient developed episodic of epigastric pain worsening with eating and drinking, cramping-like subsided based on a few hours.  But he denies taking any NSAIDs or any pain meds for that matter.  This morning, he suddenly became nauseous and vomited 1 time large amount of dark-colored stomach content denies any chest pain shortness of breath lightheadedness.  She used to be on PPI for GERD but discontinued about 6 years ago.   Hospital Course by Problem:   Hematemesis -s/p EGD: Patient very likely had low grade / self limited                            bleeding from esophagitis / ulceration. No                            recurrent since admission. AVM noted incidentally                            and ablated -daily PPI   Acute probably on chronic transaminitis -Likely secondary to alcohol abuse -outpatient follow-up with GI.   Methadone therapy -Continue Methadone   HTN, uncontrolled -resume home meds    Hyponatremia -Euvolemic, likely secondary to beer potomania    EtOH abuse -No symptoms signs of active withdrawal     Medical Consultants:      Discharge Exam:   Vitals:   10/07/22 1057 10/07/22 1138  BP: (!) 156/96 (!) 173/87  Pulse: 75 78  Resp: 14 17  Temp: 97.7 F (36.5 C) 98.8 F (37.1 C)  SpO2: 96% 99%   Vitals:   10/07/22 1042 10/07/22 1051 10/07/22 1057 10/07/22 1138  BP: 126/80  (!) 156/96 (!) 173/87  Pulse: 71 76 75 78  Resp:  (!) 23 14 17   Temp: 97.9 F (36.6 C)  97.7 F (36.5 C) 98.8 F (37.1 C)  TempSrc:    Oral  SpO2: 97% 95% 96% 99%  Weight:      Height:        General exam: Appears calm and comfortable.    The results of significant diagnostics from this hospitalization (including imaging, microbiology, ancillary and laboratory) are listed below for reference.     Procedures and Diagnostic Studies:   CT ABDOMEN PELVIS W CONTRAST  Result Date: 10/06/2022 CLINICAL DATA:  Epigastric pain with bloody emesis. EXAM: CT ABDOMEN AND PELVIS WITH CONTRAST TECHNIQUE: Multidetector CT imaging of the abdomen and  pelvis was performed using the standard protocol following bolus administration of intravenous contrast. RADIATION DOSE REDUCTION: This exam was performed according to the departmental dose-optimization program which includes automated exposure control, adjustment of the mA and/or kV according to patient size and/or use of iterative reconstruction technique. CONTRAST:  53mL OMNIPAQUE IOHEXOL 350 MG/ML SOLN COMPARISON:  None Available. FINDINGS: Lower chest: Clear lung bases. No significant pleural or pericardial effusion. Possible mild distal esophageal wall thickening. Hepatobiliary: The liver is normal in density without suspicious focal abnormality. No evidence of gallstones, gallbladder wall thickening or biliary dilatation. Pancreas: Unremarkable. No pancreatic ductal dilatation or surrounding inflammatory changes. Spleen: Normal in size without  focal abnormality. Adrenals/Urinary Tract: Both adrenal glands appear normal. No evidence of urinary tract calculus, suspicious renal lesion or hydronephrosis. The bladder appears unremarkable for its degree of distention. Stomach/Bowel: No enteric contrast administered. The stomach appears unremarkable for its degree of distension. No evidence of bowel wall thickening, distention or surrounding inflammatory change. Retrocecal appendix appears normal. Vascular/Lymphatic: There are no enlarged abdominal or pelvic lymph nodes. Aortic and branch vessel atherosclerosis without evidence of large vessel occlusion or aneurysm. The portal, superior mesenteric and splenic veins are patent. Reproductive: The prostate gland and seminal vesicles appear unremarkable. Other: No evidence of abdominal wall mass or hernia. No ascites. Musculoskeletal: No acute or significant osseous findings. 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 IMPRESSION: 1. No acute findings or clear explanation for the patient's symptoms. 2. Possible mild distal esophageal wall thickening. 3. 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 4.  Aortic Atherosclerosis (ICD10-I70.0). Electronically Signed   By: Richardean Sale M.D.   On: 10/06/2022 14:17     Labs:   Basic Metabolic Panel: Recent Labs  Lab 10/06/22 1227  NA 131*  K 3.5  CL 96*  CO2 23  GLUCOSE 128*  BUN 7*  CREATININE 1.02  CALCIUM 8.9   GFR Estimated Creatinine Clearance: 61.1 mL/min (by C-G formula based on SCr of 1.02 mg/dL). Liver Function Tests: Recent Labs  Lab 10/06/22 1227  AST 67*  ALT 66*  ALKPHOS 52  BILITOT 0.6  PROT 8.3*  ALBUMIN 3.5   Recent Labs  Lab 10/06/22 1227  LIPASE 25   No results for input(s): "AMMONIA" in the last 168 hours. Coagulation profile Recent Labs  Lab 10/06/22 1410  INR 1.2    CBC: Recent Labs  Lab 10/06/22 1227  WBC 7.0   NEUTROABS 3.2  HGB 15.8  HCT 44.5  MCV 97.8  PLT 238   Cardiac Enzymes: No results for input(s): "CKTOTAL", "CKMB", "CKMBINDEX", "TROPONINI" in the last 168 hours. BNP: Invalid input(s): "POCBNP" CBG: No results for input(s): "GLUCAP" in the last 168 hours. D-Dimer No results for input(s): "DDIMER" in the last 72 hours. Hgb A1c No results for input(s): "HGBA1C" in the last 72 hours. Lipid Profile No results for input(s): "CHOL", "HDL", "LDLCALC", "TRIG", "CHOLHDL", "LDLDIRECT" in the last 72 hours. Thyroid function studies No results for input(s): "TSH", "T4TOTAL", "T3FREE", "THYROIDAB" in the last 72 hours.  Invalid input(s): "FREET3" Anemia work up No results for input(s): "VITAMINB12", "FOLATE", "FERRITIN", "TIBC", "IRON", "RETICCTPCT" in the last 72 hours. Microbiology No results found for this or any previous visit (from the past 240 hour(s)).   Discharge Instructions:   Discharge Instructions     Diet - low sodium heart healthy   Complete by: As directed    Increase activity slowly  Complete by: As directed       Allergies as of 10/07/2022   No Known Allergies      Medication List     STOP taking these medications    naproxen sodium 220 MG tablet Commonly known as: ALEVE       TAKE these medications    cyclobenzaprine 10 MG tablet Commonly known as: FLEXERIL Take 1 tablet (10 mg total) by mouth 2 (two) times daily as needed for muscle spasms.   lisinopril 10 MG tablet Commonly known as: ZESTRIL Take 1 tablet (10 mg total) by mouth daily.   pantoprazole 40 MG tablet Commonly known as: Protonix Take 1 tablet (40 mg total) by mouth daily.   traMADol 50 MG tablet Commonly known as: ULTRAM Take 1 tablet (50 mg total) by mouth every 6 (six) hours as needed.          Time coordinating discharge: 45 min  Signed:  Joseph Art DO  [facility] 10/07/2022, 12:11 PM

## 2022-10-07 NOTE — Progress Notes (Signed)
Discharge instructions given to pt. Pt verbalized understanding of all teaching and had no further questions. Pt currently in room waiting for TOC to deliver discharge meds.

## 2022-10-07 NOTE — Progress Notes (Signed)
Patient arrived to [place] room 3 alert and oriented x4. Pain level 0/10. Lunch tray ordered. Bed in lowest position. Call light in reach. Family at bedside. Will continue to monitor ot.

## 2022-10-07 NOTE — Anesthesia Preprocedure Evaluation (Signed)
Anesthesia Evaluation  Patient identified by MRN, date of birth, ID band Patient awake    Reviewed: Allergy & Precautions, NPO status , Patient's Chart, lab work & pertinent test results  Airway Mallampati: II  TM Distance: >3 FB Neck ROM: Full    Dental  (+) Poor Dentition   Pulmonary neg pulmonary ROS, Current Smoker and Patient abstained from smoking.   Pulmonary exam normal        Cardiovascular hypertension, Pt. on medications  Rhythm:Regular Rate:Normal     Neuro/Psych negative neurological ROS  negative psych ROS   GI/Hepatic ,GERD  ,,(+)     substance abuse    Endo/Other  negative endocrine ROS    Renal/GU negative Renal ROS  negative genitourinary   Musculoskeletal negative musculoskeletal ROS (+)  narcotic dependent  Abdominal Normal abdominal exam  (+)   Peds  Hematology negative hematology ROS (+)   Anesthesia Other Findings   Reproductive/Obstetrics                             Anesthesia Physical Anesthesia Plan  ASA: 3  Anesthesia Plan: MAC   Post-op Pain Management:    Induction: Intravenous  PONV Risk Score and Plan: Treatment may vary due to age or medical condition and Propofol infusion  Airway Management Planned: Simple Face Mask, Natural Airway and Nasal Cannula  Additional Equipment: None  Intra-op Plan:   Post-operative Plan:   Informed Consent: I have reviewed the patients History and Physical, chart, labs and discussed the procedure including the risks, benefits and alternatives for the proposed anesthesia with the patient or authorized representative who has indicated his/her understanding and acceptance.     Dental advisory given  Plan Discussed with: CRNA  Anesthesia Plan Comments:        Anesthesia Quick Evaluation

## 2022-10-07 NOTE — Op Note (Signed)
[facility] Patient Name: Caleb Hansen Procedure Date : 10/07/2022 MRN: [id number] Attending MD: Willaim Rayas. Adela Lank , MD, [id number] Date of Birth: [birth year] CSN: [id number] Age: 67 Admit Type: Inpatient Procedure:                Upper GI endoscopy Indications:              Hematemesis, history of GERD, CT showing esophageal                            thickening Providers:                Viviann Spare P. Adela Lank, MD, Pollie Friar RN, RN,                            Delton [place], RN Referring MD:              Medicines:                Monitored Anesthesia Care Complications:            No immediate complications. Estimated blood loss:                            Minimal. Estimated Blood Loss:     Estimated blood loss was minimal. Procedure:                Pre-Anesthesia Assessment:                           - Prior to the procedure, a History and Physical                            was performed, and patient medications and                            allergies were reviewed. The patient's tolerance of                            previous anesthesia was also reviewed. The risks                            and benefits of the procedure and the sedation                            options and risks were discussed with the patient.                            All questions were answered, and informed consent                            was obtained. Prior Anticoagulants: The patient has                            taken no anticoagulant or antiplatelet agents. ASA  Grade Assessment: III - A patient with severe                            systemic disease. After reviewing the risks and                            benefits, the patient was deemed in satisfactory                            condition to undergo the procedure.                           After obtaining informed consent, the endoscope was                            passed under direct vision.  Throughout the                            procedure, the patient's blood pressure, pulse, and                            oxygen saturations were monitored continuously. The                            [id number]-H190 ([id number]) Olympus endoscope was introduced                            through the mouth, and advanced to the second part                            of duodenum. The upper GI endoscopy was                            accomplished without difficulty. The patient                            tolerated the procedure well. Scope In: Scope Out: Findings:      Esophagogastric landmarks were identified: the Z-line was found at 37       cm, the gastroesophageal junction was found at 37 cm and the upper       extent of the gastric folds was found at 37 cm from the incisors.      LA Grade A esophagitis was found 37 cm from the incisors, with focal       clean based ulceration.      The exam of the esophagus was otherwise normal.      The entire examined stomach was normal.      A single small angiodysplastic lesion was found in the second portion of       the duodenum. Fulguration to ablate the lesion to prevent bleeding by       argon plasma was successful.      The exam of the duodenum was otherwise normal. Impression:               - Esophagogastric landmarks identified.                           -  LA Grade A reflux esophagitis with focal                            ulceration.                           - Normal stomach.                           - A single angiodysplastic lesion in the duodenum.                            Treated with argon plasma coagulation (APC).                           - Normal duodenum otherwise                           Patient very likely had low grade / self limited                            bleeding from esophagitis / ulceration. No                            recurrent since admission. AVM noted incidentally                            and ablated, though I think  unlikely to be related                            to his symptoms. He should be on chronic PPI given                            his history of frequent reflux symptoms. Would try                            to minimize alcohol use as well. Recommendation:           - Return patient to hospital ward for ongoing care.                           - Resume regular diet.                           - Continue present medications.                           - Can transition to oral protonix 40mg  once daily                           - Minimize alcohol use                           - Okay to be discharged home from bleeding  perspective                           - GI service will sign off for now Procedure Code(s):        --- Professional ---                           [phone], Esophagogastroduodenoscopy, flexible,                            transoral; with control of bleeding, any method Diagnosis Code(s):        --- Professional ---                           K21.00, Gastro-esophageal reflux disease with                            esophagitis, without bleeding                           K31.819, Angiodysplasia of stomach and duodenum                            without bleeding                           K92.0, Hematemesis CPT copyright 2022 [employer]. All rights reserved. The codes documented in this report are preliminary and upon coder review may  be revised to meet current compliance requirements. Remo Lipps P. Greidys Deland, MD 10/07/2022 10:43:27 AM This report has been signed electronically. Number of Addenda: 0

## 2022-10-07 NOTE — Progress Notes (Signed)
TOC meds delivered. Patient discharged to home with family via wheelchair with all belongings and meds

## 2022-10-07 NOTE — Progress Notes (Signed)
Let pt know that per Vann,DO If he tolerates food, labs look ok he may be able to go later today.

## 2022-10-07 NOTE — Transfer of Care (Signed)
Immediate Anesthesia Transfer of Care Note  Patient: Caleb Hansen  Procedure(s) Performed: ESOPHAGOGASTRODUODENOSCOPY (EGD) WITH PROPOFOL HOT HEMOSTASIS (ARGON PLASMA COAGULATION/BICAP)  Patient Location: PACU  Anesthesia Type:MAC  Level of Consciousness: drowsy and patient cooperative  Airway & Oxygen Therapy: Patient Spontanous Breathing  Post-op Assessment: Report given to RN and Post -op Vital signs reviewed and stable  Post vital signs: Reviewed and stable  Last Vitals:  Vitals Value Taken Time  BP 126/80 10/07/22 1042  Temp    Pulse 72 10/07/22 1043  Resp 16 10/07/22 1043  SpO2 97 % 10/07/22 1043  Vitals shown include unvalidated device data.  Last Pain:  Vitals:   10/07/22 0921  TempSrc: Temporal  PainSc: 0-No pain         Complications: No notable events documented.

## 2022-10-07 NOTE — Progress Notes (Signed)
Called TOC pharmacy to check on delivery time for medications. They stated that they would be delivering the meds to patients room in the next 20 mins. Patient made aware

## 2022-10-08 ENCOUNTER — Encounter (HOSPITAL_COMMUNITY): Payer: Self-pay | Admitting: Gastroenterology

## 2022-10-08 NOTE — Anesthesia Postprocedure Evaluation (Signed)
Anesthesia Post Note  Patient: Caleb Hansen  Procedure(s) Performed: ESOPHAGOGASTRODUODENOSCOPY (EGD) WITH PROPOFOL HOT HEMOSTASIS (ARGON PLASMA COAGULATION/BICAP)     Patient location during evaluation: PACU Anesthesia Type: MAC Level of consciousness: awake and alert Pain management: pain level controlled Vital Signs Assessment: post-procedure vital signs reviewed and stable Respiratory status: spontaneous breathing, nonlabored ventilation, respiratory function stable and patient connected to nasal cannula oxygen Cardiovascular status: stable and blood pressure returned to baseline Postop Assessment: no apparent nausea or vomiting Anesthetic complications: no   No notable events documented.  Last Vitals:  Vitals:   10/07/22 1057 10/07/22 1138  BP: (!) 156/96 (!) 173/87  Pulse: 75 78  Resp: 14 17  Temp: 36.5 C 37.1 C  SpO2: 96% 99%    Last Pain:  Vitals:   10/07/22 1148  TempSrc:   PainSc: 0-No pain                 Belenda Cruise P Beatriz Quintela

## 2023-06-11 ENCOUNTER — Other Ambulatory Visit: Payer: Self-pay | Admitting: Student

## 2023-06-11 DIAGNOSIS — F17210 Nicotine dependence, cigarettes, uncomplicated: Secondary | ICD-10-CM

## 2023-06-12 ENCOUNTER — Other Ambulatory Visit: Payer: Self-pay | Admitting: Student

## 2023-06-12 DIAGNOSIS — R0989 Other specified symptoms and signs involving the circulatory and respiratory systems: Secondary | ICD-10-CM

## 2023-06-18 ENCOUNTER — Ambulatory Visit
Admission: RE | Admit: 2023-06-18 | Discharge: 2023-06-18 | Disposition: A | Discharge status: Home / Self Care | Payer: Medicare Other | Source: Ambulatory Visit | Attending: Student | Admitting: Student

## 2023-06-18 DIAGNOSIS — R0989 Other specified symptoms and signs involving the circulatory and respiratory systems: Secondary | ICD-10-CM

## 2023-06-21 IMAGING — CT CT HEAD W/O CM
4 series · 15 of 47 positions shown, 17 images · non-contrast
Comparison: None.

CLINICAL DATA: Head trauma, abnormal mental status (Age 19-64y);
Facial trauma

EXAM:
CT HEAD WITHOUT CONTRAST
CT MAXILLOFACIAL WITHOUT CONTRAST
TECHNIQUE: Multidetector CT imaging of the head and maxillofacial structures
were performed using the standard protocol without intravenous
contrast. Multiplanar CT image reconstructions of the maxillofacial
structures were also generated.

[Series 3: head without · axial · non-contrast · 0.42mm/px · z∈[-140,-20]mm · 7 of 32 slices shown, 9 images]
[im 4/32  brain]
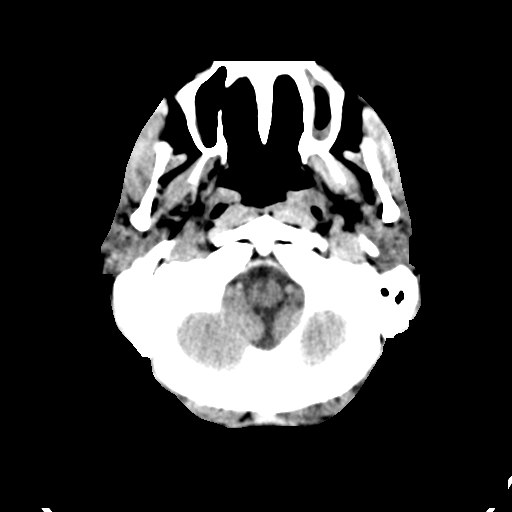
[im 4/32  bone]
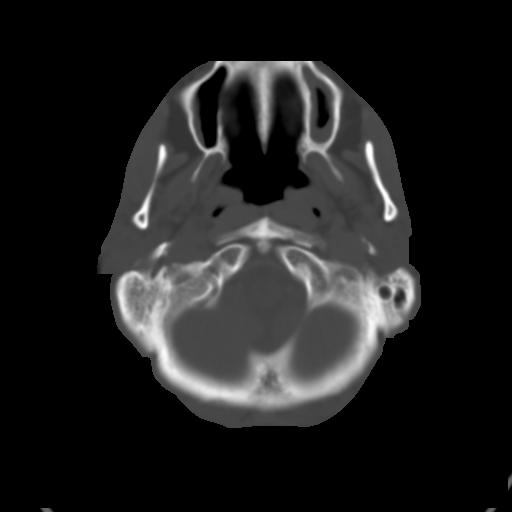
[im 8/32  brain]
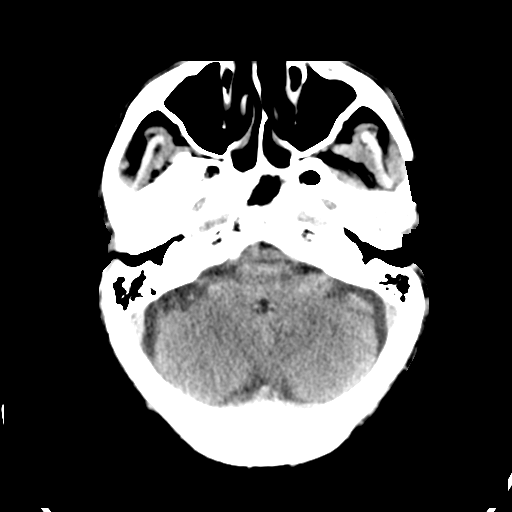
[im 12/32  brain]
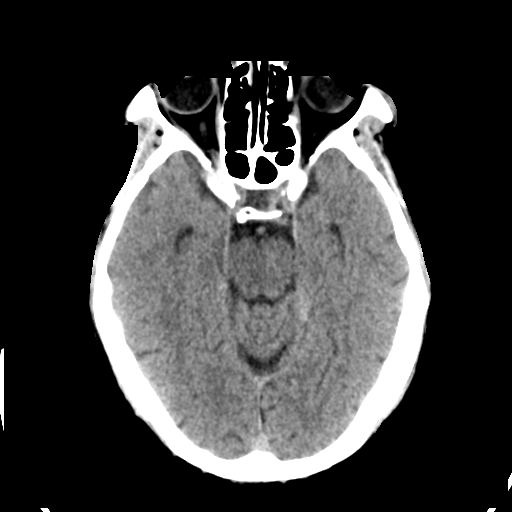
[im 16/32  brain]
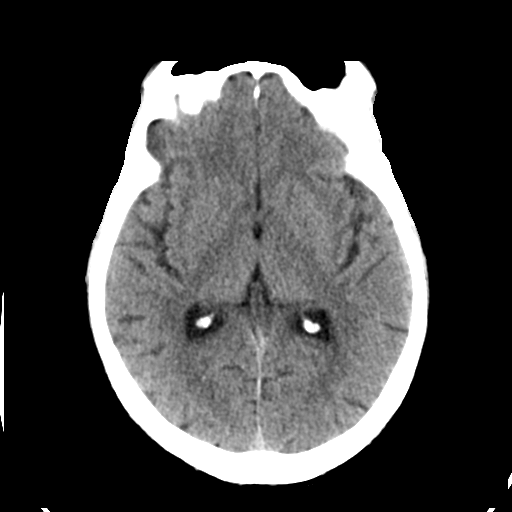
[im 20/32  brain]
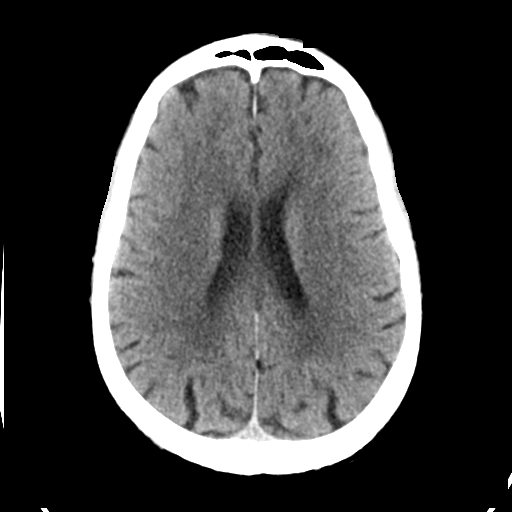
[im 20/32  bone]
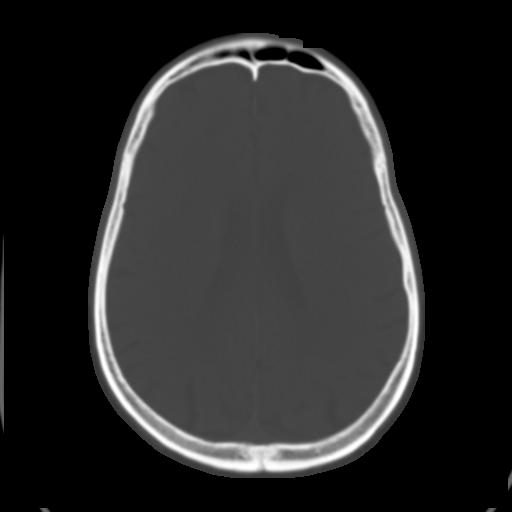
[im 24/32  brain]
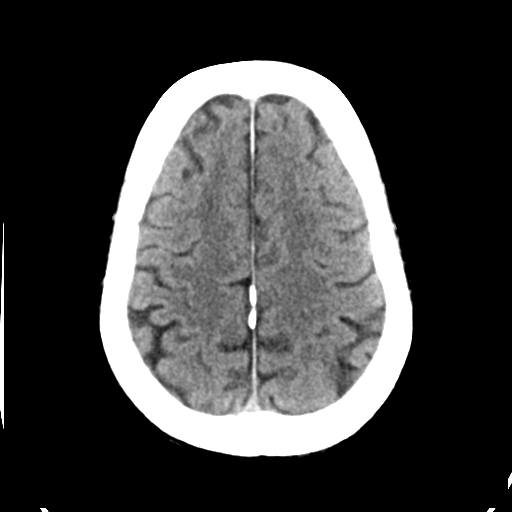
[im 28/32  brain]
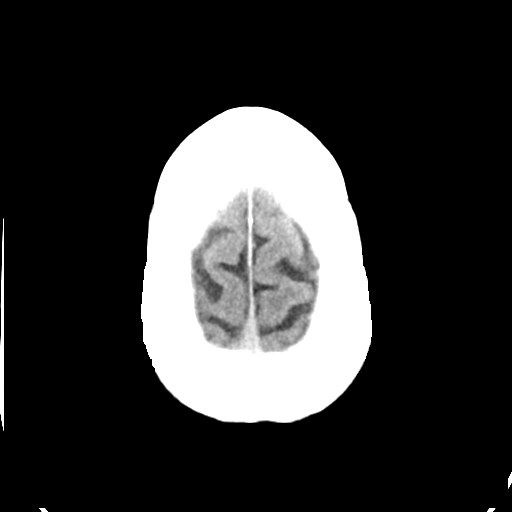

[Series 4: head bone · axial · 0.42mm/px · z∈[-141,-125]mm · 2 of 80 slices shown]
[im 8/80  bone]
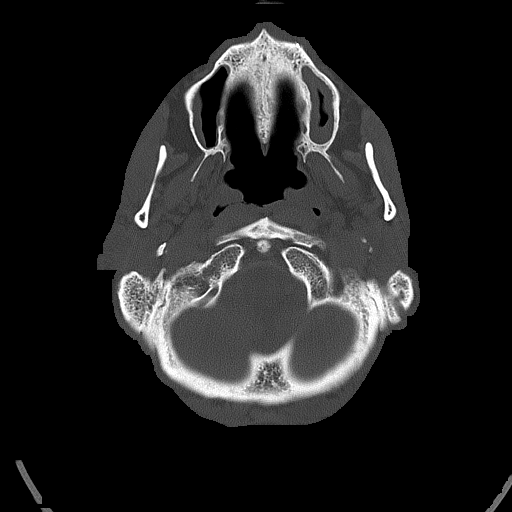
[im 16/80  bone]
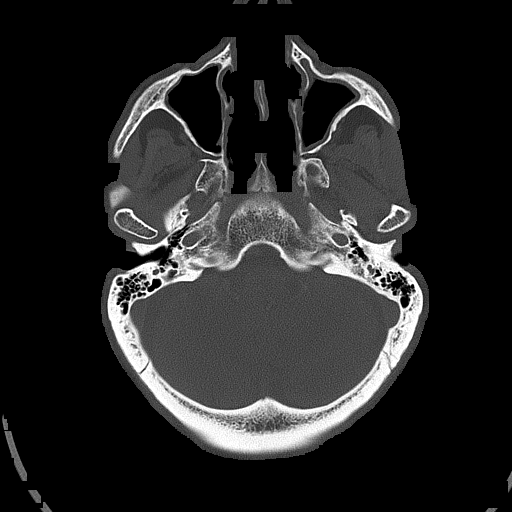

[Series 5: head without cor · coronal · non-contrast · 0.31mm/px · 3 of 72 slices shown]
[im 24/72  brain]
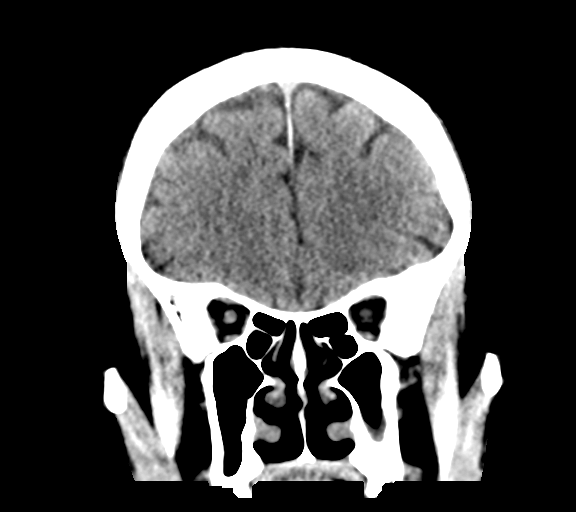
[im 32/72  brain]
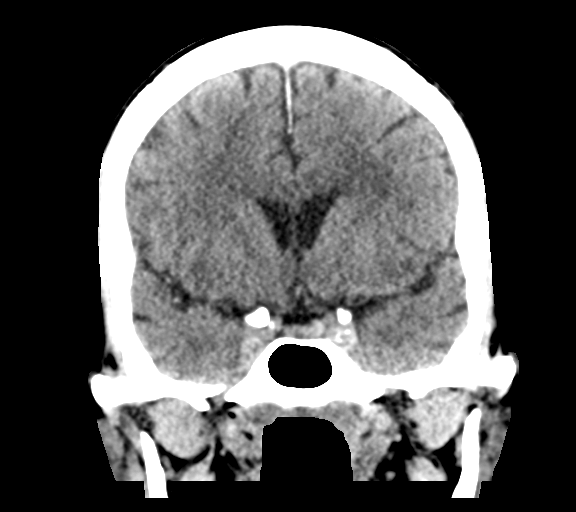
[im 40/72  brain]
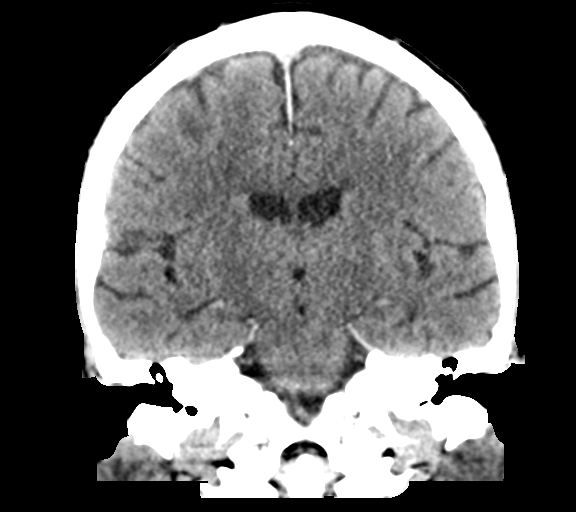

[Series 6: head without sag · sagittal · non-contrast · 0.31mm/px · 3 of 61 slices shown]
[im 21/61  brain]
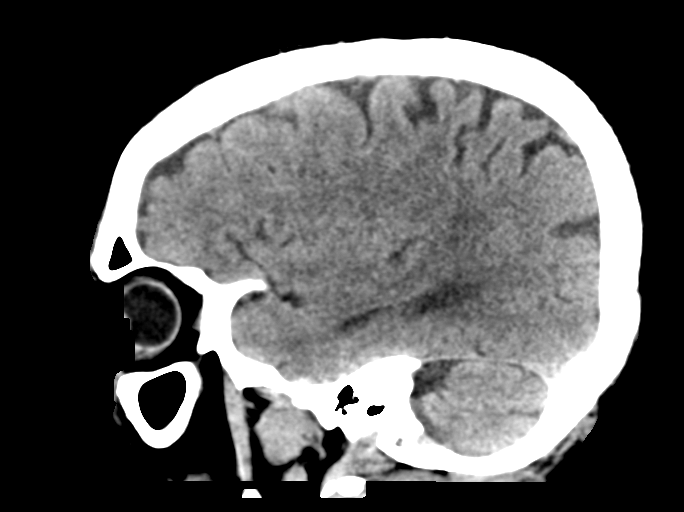
[im 31/61  brain]
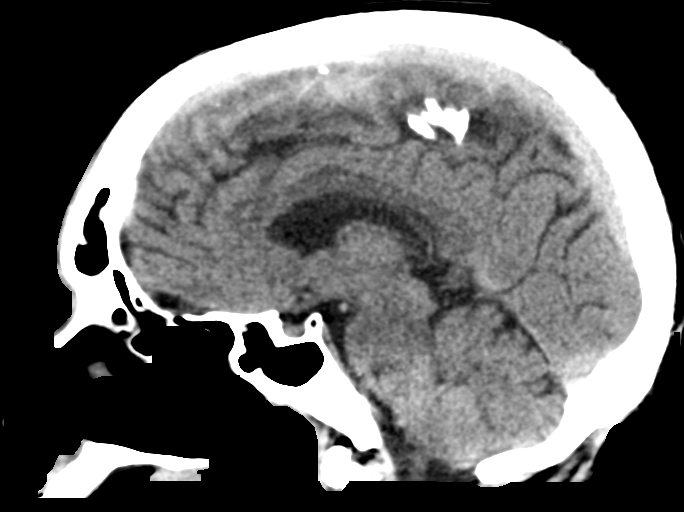
[im 41/61  brain]
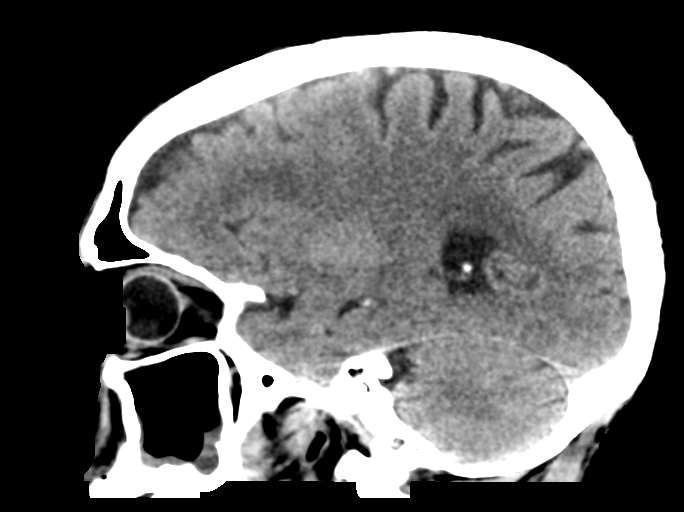

[15 of 47 positions shown; findings below may reference images not displayed]

FINDINGS: CT HEAD FINDINGS

Brain: No evidence of acute infarction, hemorrhage, hydrocephalus,
extra-axial collection or mass lesion/mass effect. Mild to moderate
patchy white matter hypoattenuation, nonspecific, but compatible
with chronic microvascular disease.

Vascular: No hyperdense vessel identified. Calcific intracranial
atherosclerosis. No acute fracture.

Skull: Left forehead contusion without acute fracture.

Other: No mastoid effusions.

CT MAXILLOFACIAL FINDINGS

Osseous: Left medial orbital findings described below. Otherwise, no
evidence of acute fracture. Bilateral TMJ joints are located.

Orbits: Mild inward bowing/deformity of the left medial orbital
wall. No adjacent retro bulbar hematoma or ethmoid air cell
hemosinus.

Sinuses: Mild to moderate paranasal sinus mucosal thickening without
air-fluid level. Frothy secretions left maxillary sinus

Soft tissues: Left forehead contusion.
IMPRESSION: CT maxillofacial:

1. Mild inward bowing/deformity of the left medial orbital wall,
compatible with fracture that is age-indeterminate without priors
but favored remote given no adjacent retrobulbar hematoma or ethmoid
air cell hemosinus.
2. Mild to moderate paranasal sinus mucosal thickening with frothy
secretions in the left maxillary sinus.
3. Left forehead contusion.

CT head:

No evidence of acute intracranial abnormality.

## 2023-06-21 IMAGING — CT CT MAXILLOFACIAL W/O CM
3 series · 16 of 47 positions shown, 19 images · non-contrast
Comparison: None.

CLINICAL DATA: Head trauma, abnormal mental status (Age 19-64y);
Facial trauma

EXAM:
CT HEAD WITHOUT CONTRAST
CT MAXILLOFACIAL WITHOUT CONTRAST
TECHNIQUE: Multidetector CT imaging of the head and maxillofacial structures
were performed using the standard protocol without intravenous
contrast. Multiplanar CT image reconstructions of the maxillofacial
structures were also generated.

[Series 3: facialbone 2.0 st · axial · 0.38mm/px · z∈[-212,-62]mm · 10 of 87 slices shown, 13 images]
[im 6/87  brain]
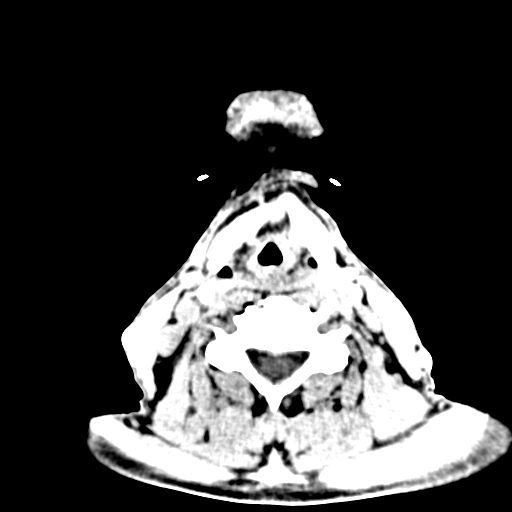
[im 6/87  bone]
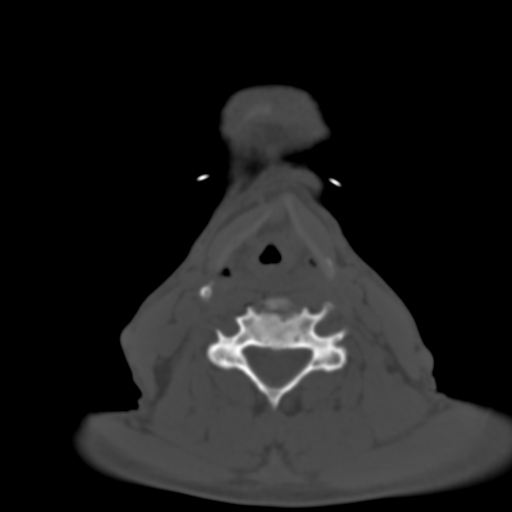
[im 15/87  bone]
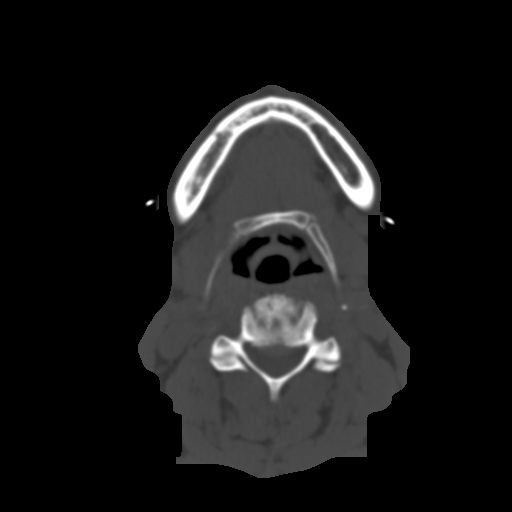
[im 24/87  bone]
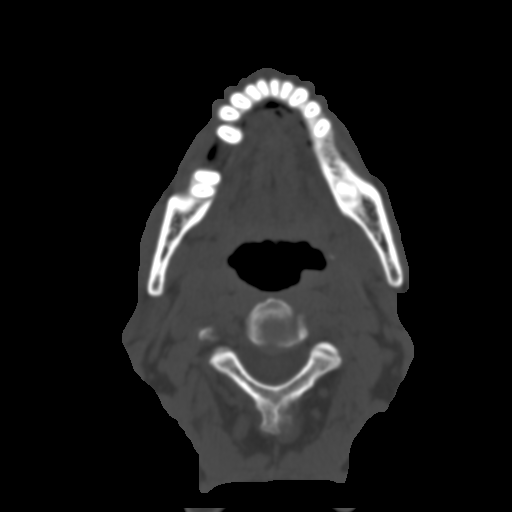
[im 30/87  bone]
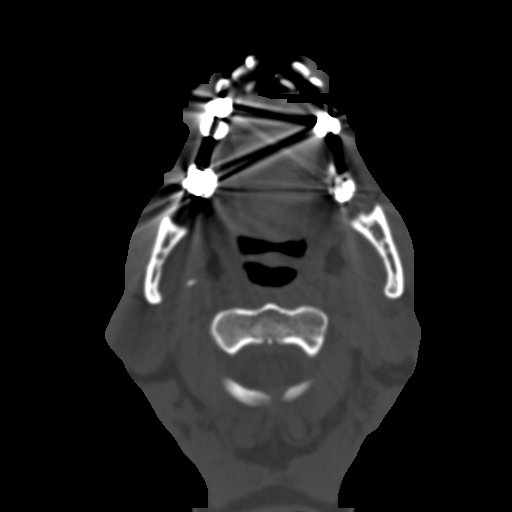
[im 39/87  brain]
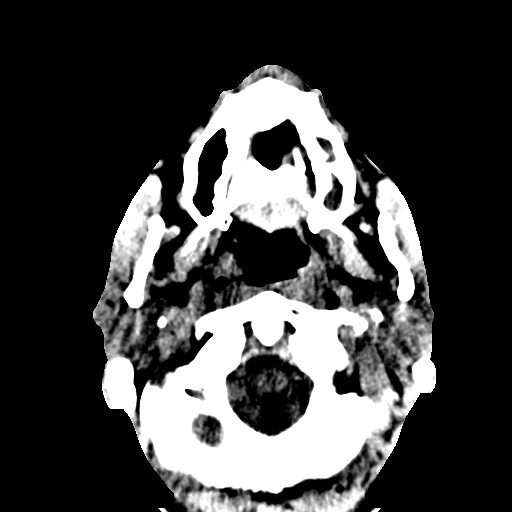
[im 39/87  bone]
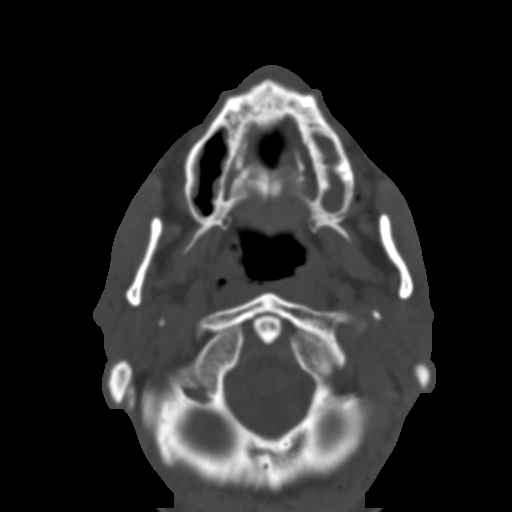
[im 48/87  bone]
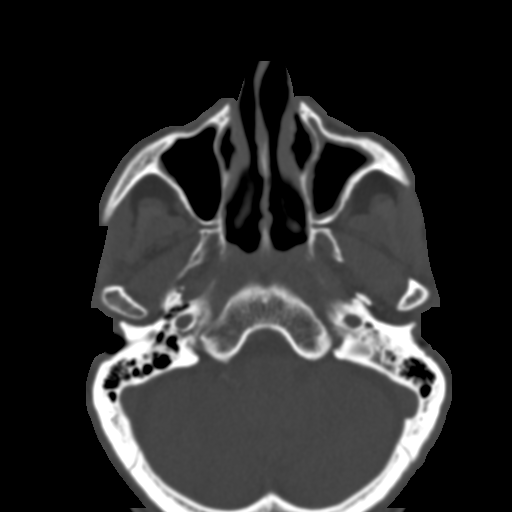
[im 57/87  bone]
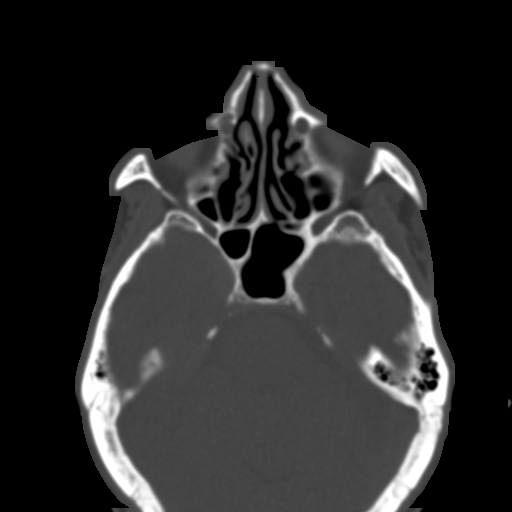
[im 66/87  bone]
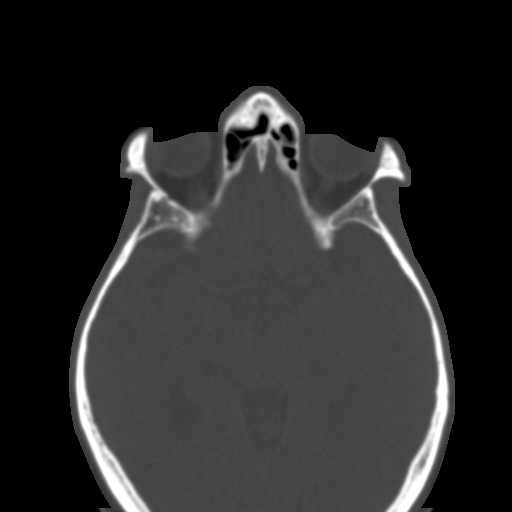
[im 72/87  brain]
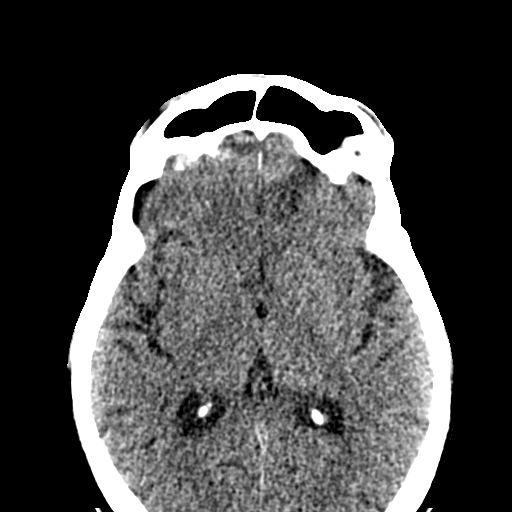
[im 72/87  bone]
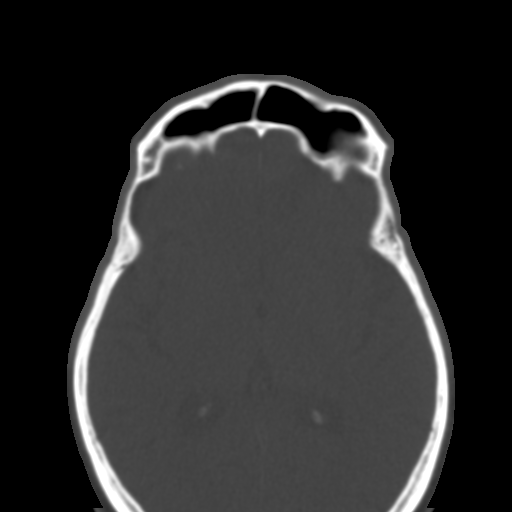
[im 81/87  bone]
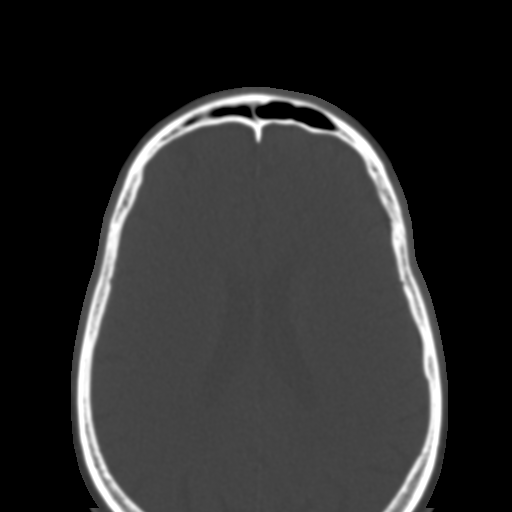

[Series 9: facialbone 2.0 cor st · coronal · 0.34mm/px · 3 of 76 slices shown]
[im 26/76  bone]
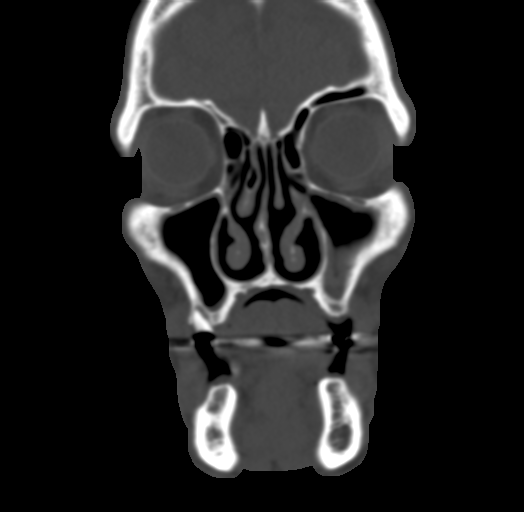
[im 34/76  bone]
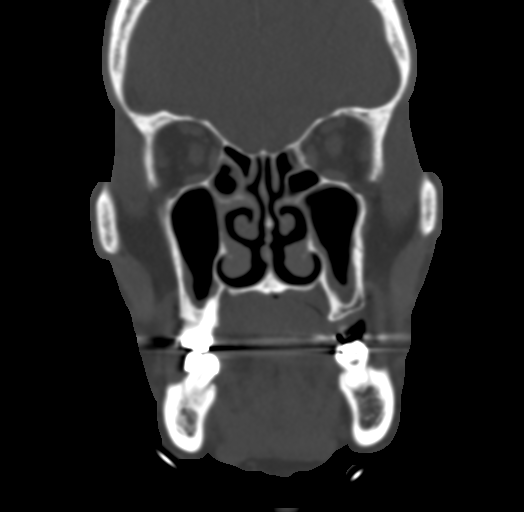
[im 42/76  bone]
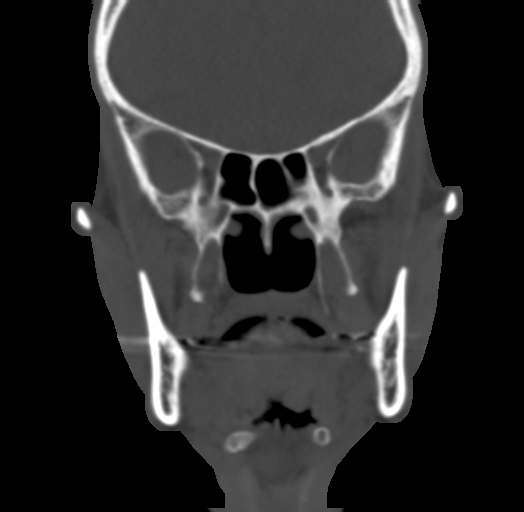

[Series 10: facialbone 2.0 sag st · sagittal · 0.34mm/px · 3 of 88 slices shown]
[im 30/88  bone]
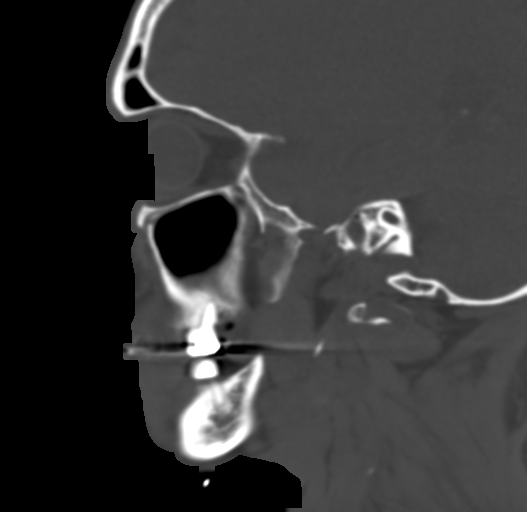
[im 44/88  bone]
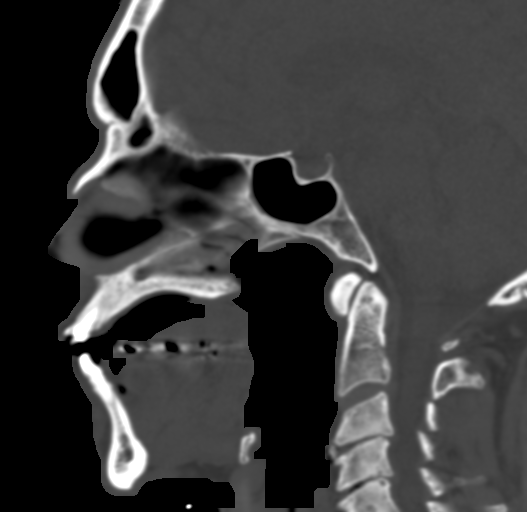
[im 59/88  bone]
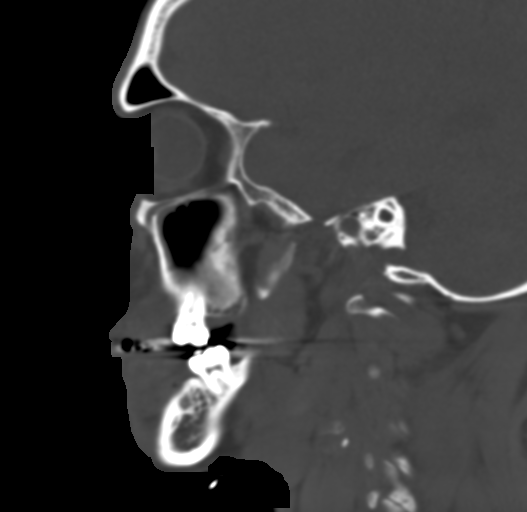

[16 of 47 positions shown; findings below may reference images not displayed]

FINDINGS: CT HEAD FINDINGS

Brain: No evidence of acute infarction, hemorrhage, hydrocephalus,
extra-axial collection or mass lesion/mass effect. Mild to moderate
patchy white matter hypoattenuation, nonspecific, but compatible
with chronic microvascular disease.

Vascular: No hyperdense vessel identified. Calcific intracranial
atherosclerosis. No acute fracture.

Skull: Left forehead contusion without acute fracture.

Other: No mastoid effusions.

CT MAXILLOFACIAL FINDINGS

Osseous: Left medial orbital findings described below. Otherwise, no
evidence of acute fracture. Bilateral TMJ joints are located.

Orbits: Mild inward bowing/deformity of the left medial orbital
wall. No adjacent retro bulbar hematoma or ethmoid air cell
hemosinus.

Sinuses: Mild to moderate paranasal sinus mucosal thickening without
air-fluid level. Frothy secretions left maxillary sinus

Soft tissues: Left forehead contusion.
IMPRESSION: CT maxillofacial:

1. Mild inward bowing/deformity of the left medial orbital wall,
compatible with fracture that is age-indeterminate without priors
but favored remote given no adjacent retrobulbar hematoma or ethmoid
air cell hemosinus.
2. Mild to moderate paranasal sinus mucosal thickening with frothy
secretions in the left maxillary sinus.
3. Left forehead contusion.

CT head:

No evidence of acute intracranial abnormality.

## 2023-06-29 ENCOUNTER — Ambulatory Visit
Admission: RE | Admit: 2023-06-29 | Discharge: 2023-06-29 | Disposition: A | Discharge status: Home / Self Care | Payer: Medicare Other | Source: Ambulatory Visit | Attending: Student | Admitting: Student

## 2023-06-29 DIAGNOSIS — F17210 Nicotine dependence, cigarettes, uncomplicated: Secondary | ICD-10-CM

## 2023-07-27 ENCOUNTER — Other Ambulatory Visit: Payer: Self-pay | Admitting: Student

## 2023-07-27 DIAGNOSIS — B182 Chronic viral hepatitis C: Secondary | ICD-10-CM

## 2023-08-06 ENCOUNTER — Ambulatory Visit
Admission: RE | Admit: 2023-08-06 | Discharge: 2023-08-06 | Disposition: A | Discharge status: Home / Self Care | Payer: Medicare Other | Source: Ambulatory Visit | Attending: Student | Admitting: Student

## 2023-08-06 DIAGNOSIS — B182 Chronic viral hepatitis C: Secondary | ICD-10-CM

## 2023-11-02 ENCOUNTER — Encounter: Payer: Self-pay | Admitting: Physician Assistant

## 2023-12-10 ENCOUNTER — Other Ambulatory Visit: Payer: Self-pay

## 2023-12-10 DIAGNOSIS — M19019 Primary osteoarthritis, unspecified shoulder: Secondary | ICD-10-CM | POA: Insufficient documentation

## 2023-12-10 DIAGNOSIS — R4702 Dysphasia: Secondary | ICD-10-CM | POA: Insufficient documentation

## 2023-12-10 DIAGNOSIS — I700 Atherosclerosis of aorta: Secondary | ICD-10-CM | POA: Insufficient documentation

## 2023-12-10 DIAGNOSIS — E785 Hyperlipidemia, unspecified: Secondary | ICD-10-CM | POA: Insufficient documentation

## 2023-12-10 DIAGNOSIS — B182 Chronic viral hepatitis C: Secondary | ICD-10-CM | POA: Insufficient documentation

## 2023-12-14 NOTE — Progress Notes (Deleted)
 12/14/2023 Caleb Hansen [id number] 10-Jan-1956  Referring provider: Hillery Aldo, NP Primary GI doctor: Dr. Adela Lank  ASSESSMENT AND PLAN:   Assessment and Plan          GERD 10/07/22 during hospital visit EGD- LA Grade A reflux esophagitis with focal ulceration. - Normal stomach. - A single angiodysplastic lesion in the duodenum. Treated with argon plasma coagulation ( APC) . 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 male with a past medical history listed below seen by Dr. Adela Lank January 2024 for the first time during hospitalization.  Discussed the use of AI scribe software for clinical note transcription with the patient, who gave verbal consent to proceed.  History of Present Illness            He  reports that he has been smoking cigarettes. He does not have any smokeless tobacco history on file. He reports current alcohol use. He reports current drug use. Drug: Marijuana.  RELEVANT GI HISTORY, IMAGING AND LABS: Results          CBC    Component Value Date/Time   WBC 6.1 10/07/2022 1210   RBC 4.61 10/07/2022 1210   HGB 16.0 10/07/2022 1210   HCT 45.9 10/07/2022 1210   PLT 216 10/07/2022 1210   MCV 99.6 10/07/2022 1210   MCH 34.7 (H) 10/07/2022 1210   MCHC 34.9 10/07/2022 1210   RDW 12.3 10/07/2022 1210   LYMPHSABS 2.9 10/06/2022 1227   MONOABS 0.8 10/06/2022 1227   EOSABS 0.0 10/06/2022 1227   BASOSABS 0.0 10/06/2022 1227   No results for input(s): "HGB" in the last 8760 hours.  CMP      Component Value Date/Time   NA 135 10/07/2022 1210   K 3.7 10/07/2022 1210   CL 96 (L) 10/07/2022 1210   CO2 28 10/07/2022 1210   GLUCOSE 99 10/07/2022 1210   BUN 9 10/07/2022 1210   CREATININE 0.98 10/07/2022 1210   CALCIUM 8.9 10/07/2022 1210   PROT 8.4 (H) 10/07/2022 1210   ALBUMIN 3.4 (L) 10/07/2022 1210   AST 63 (H) 10/07/2022 1210   ALT 61 (H) 10/07/2022 1210   ALKPHOS 51 10/07/2022 1210   BILITOT 0.8 10/07/2022 1210   GFRNONAA >60 10/07/2022 1210      Latest Ref Rng & Units 10/07/2022   12:10 PM 10/06/2022   12:27 PM  Hepatic Function  Total Protein 6.5 - 8.1 g/dL 8.4  8.3   Albumin 3.5 - 5.0 g/dL 3.4  3.5   AST 15 - 41 U/L 63  67   ALT 0 - 44 U/L 61  66   Alk Phosphatase 38 - 126 U/L 51  52   Total Bilirubin 0.3 - 1.2 mg/dL 0.8  0.6   Bilirubin, Direct 0.0 - 0.2 mg/dL 0.2        Current Medications:    Current Outpatient Medications (Cardiovascular):    hydrALAZINE (APRESOLINE) 10 MG tablet, Take 10 mg by mouth in the morning and at bedtime.   lisinopril (PRINIVIL,ZESTRIL) 10 MG tablet, Take 1 tablet (10 mg total) by  mouth daily.   rosuvastatin (CRESTOR) 5 MG tablet, Take 5 mg by mouth daily.   Current Outpatient Medications (Analgesics):    traMADol (ULTRAM) 50 MG tablet, Take 1 tablet (50 mg total) by mouth every 6 (six) hours as needed. (Patient not taking: Reported on 10/07/2022)   Current Outpatient Medications (Other):    cyclobenzaprine (FLEXERIL) 10 MG tablet, Take 1 tablet (10 mg total) by mouth 2 (two) times daily as needed for muscle spasms. (Patient not taking: Reported on 10/07/2022)   gabapentin (NEURONTIN) 300 MG capsule, Take 300 mg by mouth at bedtime.   pantoprazole (PROTONIX) 40 MG tablet, Take 1 tablet (40 mg total) by mouth daily.   traZODone (DESYREL) 50 MG tablet, Take 50 mg by mouth at bedtime as needed for sleep.  Medical History:  Past Medical History:  Diagnosis Date   GERD (gastroesophageal reflux disease)    Hypertension 2023    Opiate dependence (HCC) 09/2022   Previous snorting of heroin.  Maintained on methadone for years as of 09/2022   Allergies: No Known Allergies   Surgical History:  He  has a past surgical history that includes Elbow surgery (Left); Esophagogastroduodenoscopy (egd) with propofol (N/A, 10/07/2022); and Hot hemostasis (N/A, 10/07/2022). Family History:  His family history includes Diabetes in his mother; Hypertension in his mother.  REVIEW OF SYSTEMS  : All other systems reviewed and negative except where noted in the History of Present Illness.  PHYSICAL EXAM: There were no vitals taken for this visit. Physical Exam          Doree Albee, PA-C 3:38 PM

## 2023-12-15 ENCOUNTER — Ambulatory Visit: Payer: Medicare Other | Admitting: Physician Assistant

## 2023-12-29 ENCOUNTER — Encounter: Payer: Self-pay | Admitting: Podiatry

## 2023-12-29 ENCOUNTER — Ambulatory Visit (INDEPENDENT_AMBULATORY_CARE_PROVIDER_SITE_OTHER): Admitting: Podiatry

## 2023-12-29 VITALS — Ht 69.0 in | Wt 130.0 lb

## 2023-12-29 DIAGNOSIS — D2370 Other benign neoplasm of skin of unspecified lower limb, including hip: Secondary | ICD-10-CM | POA: Diagnosis not present

## 2023-12-29 DIAGNOSIS — D2371 Other benign neoplasm of skin of right lower limb, including hip: Secondary | ICD-10-CM | POA: Diagnosis not present

## 2023-12-29 DIAGNOSIS — G609 Hereditary and idiopathic neuropathy, unspecified: Secondary | ICD-10-CM | POA: Diagnosis not present

## 2023-12-29 NOTE — Progress Notes (Signed)
 "  Subjective:  Patient ID: Caleb Hansen, male    DOB: [birth year],  MRN: [id number]  Chief Complaint  Patient presents with   Peripheral Neuropathy    NP - Toxic Neuropathy and Callus.    Discussed the use of AI scribe software for clinical note transcription with the patient, who gave verbal consent to proceed.  History of Present Illness Caleb Hansen is a 68 year old male who presents with a callus on his right third toe.  He has a callus on his right third toe, described as having a 'little core' and persisting despite self-management attempts. The callus has been present for a significant duration, and he has tried cutting it, which sometimes results in bleeding. The callus recurs after removal of the hard part.  He experiences tingling and burning sensations in his feet, particularly affecting the toes, described as feeling like they are 'on fire'. He is currently taking gabapentin for these symptoms. No numbness is reported.  In terms of footwear, he prefers shoes that are slightly loose to accommodate extra socks and avoid tightness, which he believes contributes to foot discomfort. He used to wear [employer] but found them lacking in arch support.  He denies being diabetic but states he is prediabetic. He has a history of gout but has not had an episode in years.      Objective:    Physical Exam VASCULAR: DP and PT pulse palpable. Foot is warm and well-perfused. Capillary fill time is brisk. Good circulation. DERMATOLOGIC: Normal skin turgor, texture, and temperature. No open lesions, rashes, or ulcerations. Right third toe with poro keratosis at tip. NEUROLOGIC: Altered sensation involving approximately the plantar 50% of the forefoot with decree sensation and paresthesias ORTHOPEDIC: Smooth, pain-free range of motion of all examined joints. No ecchymosis or bruising. No gross deformity. No pain to palpation.   No images are attached to the encounter.     Results Procedure: Callus excision Description: Trimmed callus and removed core from right third toe. Applied medicated cream and placed adhesive bandage over the area.   Assessment:   1. Benign neoplasm of skin of toe   2. Idiopathic neuropathy      Plan:  Patient was evaluated and treated and all questions answered.  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 Differential diagnosis includes porokeratosis or a developing wart. Not associated with diabetes or circulation issues; he has good circulation. Self-treatment led to occasional bleeding and incomplete removal. Salicylic acid treatment is recommended for blistering and peeling to remove deep layers. Surgical excision is an option but has a high recurrence rate, so medicated treatment is preferred initially. - Sharply debrided the lesion to remove the central core.  Following enucleation of the core salinocaine and salicylic ointment treatment was applied.  Dressed with a bandage. - Recommend over-the-counter salicylic acid cream or medicated pads, preferably 40% strength, to be applied nightly until the lesion resolves. - Advise him to leave the Band-Aid on for 24 hours, then wash with soap and water before applying the salicylic acid. - Instruct him to continue treatment until the lesion is completely resolved, ensuring no hard core remains. - Advise him to wear shoes with adequate room to prevent pressure on the toes.  Peripheral neuropathy Tingling and burning sensations in the feet, particularly in the toes. Symptoms managed with gabapentin prescribed by the primary care physician. Likely age-related, as he  is prediabetic with good circulation, ruling out diabetes and circulation problems as causes. - Continue gabapentin as prescribed by the primary care physician.  Gout Inactive gout with no episodes in  years.      Return if symptoms worsen or fail to improve.   "

## 2023-12-29 NOTE — Patient Instructions (Signed)
 VISIT SUMMARY:  Today, you were seen for a callus on your right third toe and tingling and burning sensations in your feet. We discussed your current symptoms, treatment options, and footwear preferences. You also mentioned your history of gout, which has been inactive for years.  YOUR PLAN:  -POROKERATOSIS: Porokeratosis is a type of callus with a central core that can be painful and persistent. We recommend trimming the lesion to remove the central core and using over-the-counter salicylic acid cream or medicated pads (40% strength) nightly until the lesion resolves. Leave the Band-Aid on for 24 hours, then wash with soap and water before reapplying the salicylic acid. Continue this treatment until the lesion is completely resolved, ensuring no hard core remains. Wear shoes with adequate room to prevent pressure on the toes.  -PERIPHERAL NEUROPATHY: Peripheral neuropathy involves tingling and burning sensations in the feet, often related to nerve damage. Continue taking gabapentin as prescribed by your primary care physician to manage these symptoms.  -GOUT: Gout is a form of arthritis characterized by sudden, severe attacks of pain, swelling, and redness in the joints. Your gout is currently inactive with no recent episodes.  INSTRUCTIONS:  Please follow up with your primary care physician as needed for ongoing management of your peripheral neuropathy and any other health concerns. Continue the salicylic acid treatment for your porokeratosis until the lesion is fully resolved. If you experience any new or worsening symptoms, contact our office.

## 2024-08-05 ENCOUNTER — Ambulatory Visit: Admitting: Gastroenterology
